# Patient Record
Sex: Female | Born: 1980 | Race: Black or African American | Hispanic: No | Marital: Single | Smoking: Current every day smoker
Health system: Southern US, Community
[De-identification: ages and names within clinical notes are randomized; demographics above are authoritative.]

## PROBLEM LIST (undated history)

## (undated) HISTORY — PX: NO PAST SURGERIES: SHX2092

---

## 2017-04-19 ENCOUNTER — Emergency Department
Admission: EM | Admit: 2017-04-19 | Discharge: 2017-04-19 | Disposition: A | Discharge status: Home / Self Care | Payer: Managed Care, Other (non HMO) | Attending: Student in an Organized Health Care Education/Training Program | Admitting: Student in an Organized Health Care Education/Training Program

## 2017-04-19 ENCOUNTER — Other Ambulatory Visit: Payer: Self-pay

## 2017-04-19 ENCOUNTER — Emergency Department: Payer: Managed Care, Other (non HMO)

## 2017-04-19 ENCOUNTER — Encounter: Payer: Self-pay | Admitting: Emergency Medicine

## 2017-04-19 DIAGNOSIS — Z3A01 Less than 8 weeks gestation of pregnancy: Secondary | ICD-10-CM | POA: Diagnosis not present

## 2017-04-19 DIAGNOSIS — N939 Abnormal uterine and vaginal bleeding, unspecified: Secondary | ICD-10-CM

## 2017-04-19 DIAGNOSIS — O4690 Antepartum hemorrhage, unspecified, unspecified trimester: Secondary | ICD-10-CM

## 2017-04-19 DIAGNOSIS — O200 Threatened abortion: Secondary | ICD-10-CM | POA: Diagnosis not present

## 2017-04-19 DIAGNOSIS — Z87891 Personal history of nicotine dependence: Secondary | ICD-10-CM | POA: Insufficient documentation

## 2017-04-19 DIAGNOSIS — O209 Hemorrhage in early pregnancy, unspecified: Secondary | ICD-10-CM | POA: Insufficient documentation

## 2017-04-19 LAB — HCG, QUANTITATIVE, PREGNANCY: hCG, Beta Chain, Quant, S: 1616 m[IU]/mL — ABNORMAL HIGH (ref <5)

## 2017-04-19 LAB — ABO/RH: ABO/RH(D): A POS

## 2017-04-19 NOTE — ED Notes (Addendum)
Pt states she noticed small amount of vaginal bleeding when she wiped earlier this morning. Pt  states she currently is not experiencing any cramping or abdominal pain

## 2017-04-19 NOTE — ED Provider Notes (Signed)
[facility] Emergency Department Provider Note    First MD Initiated Contact with Patient 04/19/17 1637     (approximate)  I have reviewed the triage vital signs and the nursing notes.   HISTORY  Chief Complaint Vaginal Bleeding    HPI Patricia Sheppard is a 36 y.o. female G4 P0 high risk patient presents roughly [redacted] weeks pregnant with vaginal spotting that started earlier today.  Denies any cramping or pain.  States that she wiped after using the restroom and noted some spotting.  Denies any trauma.  No fevers.  No dysuria.  Has not had confirmed intrauterine pregnancy and is followed by high-[place] OB in [place].  Denies any other complaints.  Is very concerned that this is another miscarriage.  History reviewed. No pertinent past medical history. History reviewed. No pertinent family history. History reviewed. No pertinent surgical history. There are no active problems to display for this patient.     Prior to Admission medications   Not on File    Allergies Sulfa antibiotics    Social History Social History   Tobacco Use  . Smoking status: Former Smoker    Last attempt to quit: 04/05/2017    Years since quitting: 0.0  . Smokeless tobacco: Never Used  Substance Use Topics  . Alcohol use: No    Frequency: Never  . Drug use: No    Review of Systems Patient denies headaches, rhinorrhea, blurry vision, numbness, shortness of breath, chest pain, edema, cough, abdominal pain, nausea, vomiting, diarrhea, dysuria, fevers, rashes or hallucinations unless otherwise stated above in HPI. ____________________________________________   PHYSICAL EXAM:  VITAL SIGNS: Vitals:   04/19/17 1630 04/19/17 1645  BP: 111/85   Pulse: 85 89  Resp:    Temp:    SpO2: 97% 99%    Constitutional: Alert and oriented. Well appearing and in no acute distress. Eyes: Conjunctivae are normal.  Head: Atraumatic. Nose: No congestion/rhinnorhea. Mouth/Throat:  Mucous membranes are moist.   Neck: No stridor. Painless ROM.  Cardiovascular: Normal rate, regular rhythm. Grossly normal heart sounds.  Good peripheral circulation. Respiratory: Normal respiratory effort.  No retractions. Lungs CTAB. Gastrointestinal: Soft and nontender. No distention. No abdominal bruits. No CVA tenderness. Genitourinary: deferred Musculoskeletal: No lower extremity tenderness nor edema.  No joint effusions. Neurologic:  Normal speech and language. No gross focal neurologic deficits are appreciated. No facial droop Skin:  Skin is warm, dry and intact. No rash noted. Psychiatric: Mood and affect are normal. Speech and behavior are normal.  ____________________________________________   LABS (all labs ordered are listed, but only abnormal results are displayed)  Results for orders placed or performed during the hospital encounter of 04/19/17 (from the past 24 hour(s))  hCG, quantitative, pregnancy     Status: Abnormal   Collection Time: 04/19/17  3:26 PM  Result Value Ref Range   hCG, Beta Chain, Quant, S 1,616 (H) <5 mIU/mL  ABO/Rh     Status: None   Collection Time: 04/19/17  3:26 PM  Result Value Ref Range   ABO/RH(D) A POS    ____________________________________________  ____________________________________________  RADIOLOGY  I personally reviewed all radiographic images ordered to evaluate for the above acute complaints and reviewed radiology reports and findings.  These findings were personally discussed with the patient.  Please see medical record for radiology report.  ____________________________________________   PROCEDURES  Procedure(s) performed:  Procedures    Critical Care performed: no ____________________________________________   INITIAL IMPRESSION / ASSESSMENT AND PLAN / ED COURSE  Pertinent labs & imaging results that were available during my care of the patient were reviewed by me and considered in my medical decision making  (see chart for details).  DDX: threatened ab, miscarriage, ectopic, subchorionic hematoma  Patricia Sheppard is a 36 y.o. who presents to the ED with vaginal bleeding in early pregnancy.  Patient is Rh+.  Imaging ordered for the above differential shows intrauterine pregnancy but unable to confirm viable pregnancy likely due to early gestation.  Patient does not have any signs or symptoms of significant acute blood loss.  Patient otherwise stable and appropriate for further workup as an outpatient.      ____________________________________________   FINAL CLINICAL IMPRESSION(S) / ED DIAGNOSES  Final diagnoses:  Vaginal bleeding  Vaginal bleeding in pregnancy  Threatened miscarriage      NEW MEDICATIONS STARTED DURING THIS VISIT:  This SmartLink is deprecated. Use AVSMEDLIST instead to display the medication list for a patient.   Note:  This document was prepared using Dragon voice recognition software and may include unintentional dictation errors.    Willy Eddyobinson, Vern Prestia, MD 04/19/17 [phone]

## 2017-04-19 NOTE — ED Triage Notes (Signed)
Pt c/o vaginal bleeding starting today. Noticed when wiped after using bathroom. Is [redacted] weeks pregnant. [facility]. G4 P0. has had 3 miscarriages.  Last Monday HCG was 1006 per pt, has not had fridays results yet.

## 2018-05-11 NOTE — L&D Delivery Note (Addendum)
38yo [name] at 33+6wks with IUFD, induction of labor for a fetal demise. She received an epidural and a single 123mcg dose of vaginal cytotec. She delivered at approximately 0315 this morning in the bed, discovered by the nurse fully delivered with placenta in situ. She is female with normal anatomy, no signs of physical defect. I delivered the placenta with gentle cord traction, which was intact. The cord insertion into the baby was clotted. Placenta appeared discoid and calcified. No other abnormalities. Specifically, no cord insertion abnormalities, normal cranium, no evidence of fetal heart defect, normal limbs and spine, normal facial features, normal extremities.  No tears. Bleeding minimal. Fundus firm. Postpartum pitocin started. Baby "Patricia Sheppard" wrapped and passed to her mother. No cord blood collected.  She plans an autopsy and I am collecting placental villi for SNP microarray. Labs collected previously.

## 2018-11-22 ENCOUNTER — Other Ambulatory Visit: Payer: Self-pay

## 2018-11-22 ENCOUNTER — Ambulatory Visit
Admission: EM | Admit: 2018-11-22 | Discharge: 2018-11-22 | Disposition: A | Discharge status: Home / Self Care | Payer: Managed Care, Other (non HMO) | Attending: Urgent Care | Admitting: Urgent Care

## 2018-11-22 DIAGNOSIS — Z3201 Encounter for pregnancy test, result positive: Secondary | ICD-10-CM

## 2018-11-22 DIAGNOSIS — O0990 Supervision of high risk pregnancy, unspecified, unspecified trimester: Secondary | ICD-10-CM | POA: Diagnosis not present

## 2018-11-22 DIAGNOSIS — O2620 Pregnancy care for patient with recurrent pregnancy loss, unspecified trimester: Secondary | ICD-10-CM

## 2018-11-22 DIAGNOSIS — O210 Mild hyperemesis gravidarum: Secondary | ICD-10-CM

## 2018-11-22 LAB — CBC WITH DIFFERENTIAL/PLATELET
Abs Immature Granulocytes: 0.06 10*3/uL (ref 0.00–0.07)
Basophils Absolute: 0 10*3/uL (ref 0.0–0.1)
Basophils Relative: 0 %
Eosinophils Absolute: 0 10*3/uL (ref 0.0–0.5)
Eosinophils Relative: 0 %
HCT: 32.4 % — ABNORMAL LOW (ref 36.0–46.0)
Hemoglobin: 11.2 g/dL — ABNORMAL LOW (ref 12.0–15.0)
Immature Granulocytes: 1 %
Lymphocytes Relative: 15 %
Lymphs Abs: 1.2 10*3/uL (ref 0.7–4.0)
MCH: 33 pg (ref 26.0–34.0)
MCHC: 34.6 g/dL (ref 30.0–36.0)
MCV: 95.6 fL (ref 80.0–100.0)
Monocytes Absolute: 1.1 10*3/uL — ABNORMAL HIGH (ref 0.1–1.0)
Monocytes Relative: 15 %
Neutro Abs: 5.5 10*3/uL (ref 1.7–7.7)
Neutrophils Relative %: 69 %
Platelets: 181 10*3/uL (ref 150–400)
RBC: 3.39 MIL/uL — ABNORMAL LOW (ref 3.87–5.11)
RDW: 13.4 % (ref 11.5–15.5)
WBC: 7.9 10*3/uL (ref 4.0–10.5)
nRBC: 0 % (ref 0.0–0.2)

## 2018-11-22 LAB — HCG, QUANTITATIVE, PREGNANCY: hCG, Beta Chain, Quant, S: 40947 m[IU]/mL — ABNORMAL HIGH (ref <5)

## 2018-11-22 LAB — BASIC METABOLIC PANEL
Anion gap: 10 (ref 5–15)
BUN: 6 mg/dL (ref 6–20)
CO2: 21 mmol/L — ABNORMAL LOW (ref 22–32)
Calcium: 8.8 mg/dL — ABNORMAL LOW (ref 8.9–10.3)
Chloride: 102 mmol/L (ref 98–111)
Creatinine, Ser: 0.63 mg/dL (ref 0.44–1.00)
GFR calc Af Amer: >60 mL/min (ref >60)
GFR calc non Af Amer: >60 mL/min (ref >60)
Glucose, Bld: 80 mg/dL (ref 70–99)
Potassium: 3.5 mmol/L (ref 3.5–5.1)
Sodium: 133 mmol/L — ABNORMAL LOW (ref 135–145)

## 2018-11-22 LAB — PREGNANCY, URINE: Preg Test, Ur: POSITIVE — AB

## 2018-11-22 MED ORDER — ONDANSETRON 4 MG PO TBDP: 4.0000 mg | ORAL_TABLET | Freq: Three times a day (TID) | ORAL | 0 refills | Status: DC | PRN

## 2018-11-22 NOTE — Discharge Instructions (Addendum)
It was very nice seeing you today in clinic. Thank you for entrusting me with your care.   Please utilize the medications that we discussed. Your prescriptionshave been called in to your pharmacy. Stay HYDRATED. Water and electrolyte containing beverages (Gatorade, Pedialyte) are best to prevent dehydration and electrolyte abnormalities.   Make arrangements to follow up with OB/GYN to establish care and for US imaging. I have provided you the name and office contact information for an excellent local provider. If your symptoms/condition worsens, please seek follow up care either here or in the ER. Please remember, our [place] providers are "right here with you" when you need [place].   Again, it was my pleasure to take care of you today. Thank you for choosing our clinic. I hope that you start to feel better quickly.   Honor Loh, MSN, APRN, FNP-C, CEN Advanced Practice Provider [facility]

## 2018-11-22 NOTE — ED Triage Notes (Signed)
Patient states that she has been unable to keep any food down with vomiting and nausea. Patient reports that she took an at home pregnancy test and it was positive. Patient states that the last menstrual cycle she remembers was December but thinks that she might have spotted during some months.

## 2018-11-22 NOTE — ED Provider Notes (Signed)
[place], [place]   Name: Patricia Sheppard DOB: 1981-04-25 MRN: [id number] CSN: [id number] PCP: Patient, No Pcp Per  Arrival date and time:  11/22/18 1051  Chief Complaint:  Possible Pregnancy   NOTE: Prior to seeing the patient today, I have reviewed the triage nursing documentation and vital signs. Clinical staff has updated patient's PMH/PSHx, current medication list, and drug allergies/intolerances to ensure comprehensive history available to assist in medical decision making.   History:   HPI: Patricia Sheppard is a 38 y.o. female who presents today with complaints of nausea and vomiting since last week. Symptoms worse in the morning. Patient states, "I just cant seem to keep anything down in the mornings". LMP was in December, however patient advising that she experienced some spotting a couple of months back.  Patient has taken a home pregnancy test that resulted as positive. She indicates that she does not have a OB/GYN provider. Patient denies pelvic, back, and abdominal pain. She has not experienced any urinary symptoms, vaginal bleeding, or vaginal discharge since the onset of her symptoms.  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 She was able to carry one of her pregnancies to 23 weeks and one of them to 24 weeks prior to suffering the physical and psychological effects of fetal demise. Patient becomes emotional during intake assessment today when she talks about her miscarriages. She states, "they made me deliver two times, but the babies had already died".    History reviewed. No pertinent past medical history.  Past Surgical History:  Procedure Laterality Date  . NO PAST SURGERIES      History reviewed. No pertinent family history.  Social History   Tobacco Use  . Smoking status: Former Smoker    Quit date: 04/05/2017    Years since quitting: 1.6  . Smokeless tobacco: Never Used  Substance Use Topics  . Alcohol use: No     Frequency: Never  . Drug use: No    There are no active problems to display for this patient.   Home Medications:    No outpatient medications have been marked as taking for the 11/22/18 encounter [facility] Encounter).    Allergies:   Sulfa antibiotics  Review of Systems (ROS): Review of Systems  Constitutional: Negative for chills and fever.  Respiratory: Negative for cough and shortness of breath.   Cardiovascular: Negative for chest pain and palpitations.  Gastrointestinal: Positive for nausea and vomiting. Negative for abdominal pain.  Genitourinary: Negative for dysuria, flank pain, frequency, hematuria, pelvic pain, vaginal bleeding, vaginal discharge and vaginal pain.  Musculoskeletal: Negative for back pain.  Skin: Negative for color change, pallor and rash.  Neurological: Negative for dizziness, syncope, weakness and headaches.  Psychiatric/Behavioral: The patient is nervous/anxious.      Vital Signs: Today's Vitals   11/22/18 1143 11/22/18 1144 11/22/18 1146 11/22/18 1256  BP:   107/77   Pulse:   (!) 101   Resp:   18   Temp:   98.9 F (37.2 C)   TempSrc:   Oral   SpO2:   100%   Weight:  180 lb (81.6 kg)    Height:  5\' 7"  (1.702 m)    PainSc: 0-No pain   0-No pain    Physical Exam: Physical Exam  Constitutional: She is oriented to person, place, and time and well-developed, well-nourished, and in no distress.  HENT:  Head: Normocephalic and atraumatic.  Mouth/Throat: Mucous membranes are normal.  Eyes:  Pupils are equal, round, and reactive to light. EOM are normal.  Neck: Normal range of motion. No tracheal deviation present.  Cardiovascular: Normal rate, regular rhythm, normal heart sounds and intact distal pulses. Exam reveals no gallop and no friction rub.  No murmur heard. Pulmonary/Chest: Effort normal and breath sounds normal. No respiratory distress. She has no wheezes. She has no rales.  Abdominal: Soft. Normal appearance and bowel sounds are  normal. There is no abdominal tenderness. There is no CVA tenderness.  Neurological: She is alert and oriented to person, place, and time. Gait normal. GCS score is 15.  Skin: Skin is warm and dry. No rash noted.  Psychiatric: Memory, affect and judgment normal. Her mood appears anxious.  Nursing note and vitals reviewed.   Urgent Care Treatments / Results:   LABS: PLEASE NOTE: all labs that were ordered this encounter are listed, however only abnormal results are displayed. Labs Reviewed  PREGNANCY, URINE - Abnormal; Notable for the following components:      Result Value   Preg Test, Ur POSITIVE (*)    All other components within normal limits  HCG, QUANTITATIVE, PREGNANCY - Abnormal; Notable for the following components:   hCG, Beta Chain, Quant, S 40,947 (*)    All other components within normal limits  CBC WITH DIFFERENTIAL/PLATELET - Abnormal; Notable for the following components:   RBC 3.39 (*)    Hemoglobin 11.2 (*)    HCT 32.4 (*)    Monocytes Absolute 1.1 (*)    All other components within normal limits  BASIC METABOLIC PANEL - Abnormal; Notable for the following components:   Sodium 133 (*)    CO2 21 (*)    Calcium 8.8 (*)    All other components within normal limits    EKG: -None  RADIOLOGY: No results found.  PROCEDURES: Procedures  MEDICATIONS RECEIVED THIS VISIT: Medications - No data to display  PERTINENT CLINICAL COURSE NOTES/UPDATES:   Initial Impression / Assessment and Plan / Urgent Care Course:  Pertinent labs & imaging results that were available during my care of the patient were personally reviewed by me and considered in my medical decision making (see lab/imaging section of note for values and interpretations).  Sheilah MinsMiltonya Vollmer is a 38 y.o. female who [facility] today with complaints of Possible Pregnancy  Patient overall well appearing and in no acute distress today in clinic. Exam benign. Urine HCG (+) for pregnancy.  She advises that she can last recall a normal period sometime in December. She has been spotting intermittently over the last 2 months. CBC grossly normal. Beta HCG elevation corresponds with a 5-6 week pregnancy. Patient is a high risk G4P0A4s due to an incompetent cervix and advanced maternal age (>37 years old). Patient emotional in clinic. We discussed at length that in the absence of her LMP, an exact gestational age could not be provided. She will require further testing to determine gestational age and to confirm a viable intrauterine pregnancy.  Patient has no pain in her abdomen or pelvis today in clinic. She is not bleeding.  Will provide patient with antiemetic medication to use for reported nausea and vomiting that occurs mainly in the morning hours. Discussed this as being a normal pregnancy related symptom. Renal function normal. Sodium a little low at 133 mmol/L. Encouraged patient to remain hydrated, with water being the best option. Encouraged her to incorporate some electrolyte enriched fluids into her daily intake to prevent dehydration and electrolyte derangements.  Patient needs to be seen for further evaluation by high risk OB/GYN. Name and office contact information provided on today's AVS for Dr. Christeen DouglasBethany Beasley. Patient advised the she will need to contact the office to schedule an appointment to be seen ASAP. Patient observed attempting to call the office as she was leaving the clinic exam room today.   I have reviewed the follow up and strict return precautions for any new or worsening symptoms. Patient is aware of symptoms that would be deemed urgent/emergent, and would thus require further evaluation either here or in the emergency department. At the time of discharge, she verbalized understanding and consent with the discharge plan as it was reviewed with her. All questions were fielded by provider and/or clinic staff prior to patient discharge.    Final Clinical Impressions /  Urgent Care Diagnoses:   Final diagnoses:  Morning sickness  High risk pregnancy, antepartum    New Prescriptions:  [place] Controlled Substance Registry consulted? Not Applicable  Meds ordered this encounter  Medications  . ondansetron (ZOFRAN-ODT) 4 MG disintegrating tablet    Sig: Take 1 tablet (4 mg total) by mouth every 8 (eight) hours as needed for nausea or vomiting.    Dispense:  15 tablet    Refill:  0    Recommended Follow up Care:  Patient encouraged to follow up with the following provider within the specified time frame, or sooner as dictated by the severity of her symptoms. As always, she was instructed that for any urgent/emergent care needs, she should seek care either here or in the emergency department for more immediate evaluation.  Follow-up Information    Call  Christeen DouglasBeasley, Bethany, MD.   Specialty: Obstetrics and Gynecology Why: Need to be seen to establish prenatal care services due to HIGH RISK pregnancy. Contact information: [address] [place] [id number] [phone]         NOTE: This note was prepared using Dragon dictation software along with smaller phrase technology. Despite my best ability to proofread, there is the potential that transcriptional errors may still occur from this process, and are completely unintentional.     Verlee MonteGray, Myrical Andujo E, NP 11/22/18 2259

## 2018-11-25 LAB — OB RESULTS CONSOLE RPR: RPR: NONREACTIVE

## 2018-11-25 LAB — OB RESULTS CONSOLE VARICELLA ZOSTER ANTIBODY, IGG: Varicella: IMMUNE

## 2018-11-25 LAB — OB RESULTS CONSOLE RUBELLA ANTIBODY, IGM: Rubella: IMMUNE

## 2018-11-25 LAB — OB RESULTS CONSOLE HEPATITIS B SURFACE ANTIGEN: Hepatitis B Surface Ag: NEGATIVE

## 2018-12-01 LAB — OB RESULTS CONSOLE GC/CHLAMYDIA
Chlamydia: NEGATIVE
Gonorrhea: NEGATIVE

## 2018-12-08 ENCOUNTER — Other Ambulatory Visit: Payer: Self-pay

## 2018-12-09 ENCOUNTER — Encounter: Payer: Self-pay | Admitting: Internal Medicine

## 2018-12-09 ENCOUNTER — Inpatient Hospital Stay: Payer: Managed Care, Other (non HMO) | Admitting: *Deleted

## 2018-12-09 ENCOUNTER — Other Ambulatory Visit: Payer: Self-pay

## 2018-12-09 ENCOUNTER — Inpatient Hospital Stay: Payer: Managed Care, Other (non HMO) | Attending: Internal Medicine | Admitting: Internal Medicine

## 2018-12-09 VITALS — BP 114/77 | HR 93 | Temp 98.4°F | Resp 20 | Ht 67.0 in

## 2018-12-09 DIAGNOSIS — Z8249 Family history of ischemic heart disease and other diseases of the circulatory system: Secondary | ICD-10-CM | POA: Insufficient documentation

## 2018-12-09 DIAGNOSIS — D649 Anemia, unspecified: Secondary | ICD-10-CM | POA: Diagnosis not present

## 2018-12-09 DIAGNOSIS — Z87891 Personal history of nicotine dependence: Secondary | ICD-10-CM | POA: Diagnosis not present

## 2018-12-09 DIAGNOSIS — O99013 Anemia complicating pregnancy, third trimester: Secondary | ICD-10-CM | POA: Diagnosis present

## 2018-12-09 DIAGNOSIS — Z79899 Other long term (current) drug therapy: Secondary | ICD-10-CM | POA: Diagnosis not present

## 2018-12-09 DIAGNOSIS — R760 Raised antibody titer: Secondary | ICD-10-CM

## 2018-12-09 LAB — CBC WITH DIFFERENTIAL/PLATELET
Abs Immature Granulocytes: 0.03 10*3/uL (ref 0.00–0.07)
Basophils Absolute: 0 10*3/uL (ref 0.0–0.1)
Basophils Relative: 0 %
Eosinophils Absolute: 0 10*3/uL (ref 0.0–0.5)
Eosinophils Relative: 0 %
HCT: 30.4 % — ABNORMAL LOW (ref 36.0–46.0)
Hemoglobin: 10 g/dL — ABNORMAL LOW (ref 12.0–15.0)
Immature Granulocytes: 1 %
Lymphocytes Relative: 24 %
Lymphs Abs: 1.3 10*3/uL (ref 0.7–4.0)
MCH: 32.4 pg (ref 26.0–34.0)
MCHC: 32.9 g/dL (ref 30.0–36.0)
MCV: 98.4 fL (ref 80.0–100.0)
Monocytes Absolute: 0.8 10*3/uL (ref 0.1–1.0)
Monocytes Relative: 15 %
Neutro Abs: 3.1 10*3/uL (ref 1.7–7.7)
Neutrophils Relative %: 60 %
Platelets: 172 10*3/uL (ref 150–400)
RBC: 3.09 MIL/uL — ABNORMAL LOW (ref 3.87–5.11)
RDW: 14.8 % (ref 11.5–15.5)
WBC: 5.3 10*3/uL (ref 4.0–10.5)
nRBC: 0 % (ref 0.0–0.2)

## 2018-12-09 LAB — VITAMIN B12: Vitamin B-12: 218 pg/mL (ref 180–914)

## 2018-12-09 LAB — LACTATE DEHYDROGENASE: LDH: 132 U/L (ref 98–192)

## 2018-12-09 LAB — IRON AND TIBC
Iron: 96 ug/dL (ref 28–170)
Saturation Ratios: 23 % (ref 10.4–31.8)
TIBC: 412 ug/dL (ref 250–450)
UIBC: 316 ug/dL

## 2018-12-09 LAB — DAT, POLYSPECIFIC AHG (ARMC ONLY): Polyspecific AHG test: NEGATIVE

## 2018-12-09 LAB — RETICULOCYTES
Immature Retic Fract: 18.8 % — ABNORMAL HIGH (ref 2.3–15.9)
RBC.: 3.09 MIL/uL — ABNORMAL LOW (ref 3.87–5.11)
Retic Count, Absolute: 144 10*3/uL (ref 19.0–186.0)
Retic Ct Pct: 4.7 % — ABNORMAL HIGH (ref 0.4–3.1)

## 2018-12-09 LAB — FERRITIN: Ferritin: 79 ng/mL (ref 11–307)

## 2018-12-09 LAB — FOLATE: Folate: 9.4 ng/mL (ref >5.9)

## 2018-12-09 NOTE — Progress Notes (Signed)
[facility] CONSULT NOTE  Patient Care Team: Patient, No Pcp Per as PCP - General (General Practice)  CHIEF COMPLAINTS/PURPOSE OF CONSULTATION: Abnormal antibodies in blood  #July 2020-screening/pregnancy-multiple red cell antibodies [incidental] Oncology History   No history exists.     HISTORY OF PRESENTING ILLNESS:  Patricia Sheppard 38 y.o.  female who is currently pregnant third trimester with expected date of delivery on January 31, 2019.  Patient had routine blood work done with obstetrics that incidentally showed "multiple red cell antibodies-high titer low avidity antibodies or antibodies to less common antigens".  She has been referred to us for further evaluation and recommendations.  Patient states that she never had previous blood transfusions.  Patient had 4 previous miscarriages.  She never had any blood clots.  The current pregnancy is going well.  Early in the pregnancy she did have morning sickness which is currently improved.   Review of Systems  Constitutional: Negative for chills, diaphoresis, fever, malaise/fatigue and weight loss.  HENT: Negative for nosebleeds and sore throat.   Eyes: Negative for double vision.  Respiratory: Negative for cough, hemoptysis, sputum production, shortness of breath and wheezing.   Cardiovascular: Negative for chest pain, palpitations, orthopnea and leg swelling.  Gastrointestinal: Negative for abdominal pain, blood in stool, constipation, diarrhea, heartburn, melena, nausea and vomiting.  Genitourinary: Negative for dysuria, frequency and urgency.  Musculoskeletal: Negative for back pain and joint pain.  Skin: Negative.  Negative for itching and rash.  Neurological: Negative for dizziness, tingling, focal weakness, weakness and headaches.  Endo/Heme/Allergies: Does not bruise/bleed easily.  Psychiatric/Behavioral: Negative for depression. The patient is not nervous/anxious and does not have insomnia.       MEDICAL HISTORY:  No past medical history on file.  SURGICAL HISTORY: Past Surgical History:  Procedure Laterality Date  . NO PAST SURGERIES      SOCIAL HISTORY: Social History   Socioeconomic History  . Marital status: Single    Spouse name: Not on file  . Number of children: Not on file  . Years of education: Not on file  . Highest education level: Not on file  Occupational History  . Not on file  Social Needs  . Financial resource strain: Not on file  . Food insecurity    Worry: Not on file    Inability: Not on file  . Transportation needs    Medical: Not on file    Non-medical: Not on file  Tobacco Use  . Smoking status: Former Smoker    Years: 15.00    Types: Cigarettes, Cigars    Quit date: 04/05/2017    Years since quitting: 1.6  . Smokeless tobacco: Never Used  Substance and Sexual Activity  . Alcohol use: No    Frequency: Never  . Drug use: No  . Sexual activity: Not on file  Lifestyle  . Physical activity    Days per week: Not on file    Minutes per session: Not on file  . Stress: Not on file  Relationships  . Social Musicianconnections    Talks on phone: Not on file    Gets together: Not on file    Attends religious service: Not on file    Active member of club or organization: Not on file    Attends meetings of clubs or organizations: Not on file    Relationship status: Not on file  . Intimate partner violence    Fear of current or ex partner: Not on file  Emotionally abused: Not on file    Physically abused: Not on file    Forced sexual activity: Not on file  Other Topics Concern  . 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 Lives self at home; [place] in [place]     FAMILY HISTORY: Family History  Problem Relation Age of Onset  . Hypertension Maternal Grandmother     ALLERGIES:  is allergic to sulfa antibiotics.  MEDICATIONS:  Current Outpatient Medications   Medication Sig Dispense Refill  . Prenatal Vit-Fe Fumarate-FA (PNV PRENATAL PLUS MULTIVITAMIN) 27-1 MG TABS Take 1 tablet by mouth daily.    . promethazine (PHENERGAN) 25 MG tablet Take 1 tablet by mouth daily as needed for nausea.    . ondansetron (ZOFRAN-ODT) 4 MG disintegrating tablet Take 1 tablet (4 mg total) by mouth every 8 (eight) hours as needed for nausea or vomiting. 15 tablet 0   No current facility-administered medications for this visit.       Marland Kitchen.  PHYSICAL EXAMINATION: ECOG PERFORMANCE STATUS: 0 - Asymptomatic  Vitals:   12/09/18 1442  BP: 114/77  Pulse: 93  Resp: 20  Temp: 98.4 F (36.9 C)   There were no vitals filed for this visit.  Physical Exam  Constitutional: She is oriented to person, place, and time and well-developed, well-nourished, and in no distress.  HENT:  Head: Normocephalic and atraumatic.  Mouth/Throat: Oropharynx is clear and moist. No oropharyngeal exudate.  Eyes: Pupils are equal, round, and reactive to light.  Neck: Normal range of motion. Neck supple.  Cardiovascular: Normal rate and regular rhythm.  Pulmonary/Chest: No respiratory distress. She has no wheezes.  Abdominal: Soft. Bowel sounds are normal. She exhibits no distension and no mass. There is no abdominal tenderness. There is no rebound and no guarding.  Musculoskeletal: Normal range of motion.        General: No tenderness or edema.  Neurological: She is alert and oriented to person, place, and time.  Skin: Skin is warm.  Psychiatric: Affect normal.     LABORATORY DATA:  I have reviewed the data as listed Lab Results  Component Value Date   WBC 5.3 12/09/2018   HGB 10.0 (L) 12/09/2018   HCT 30.4 (L) 12/09/2018   MCV 98.4 12/09/2018   PLT 172 12/09/2018   Recent Labs    11/22/18 1151  NA 133*  K 3.5  CL 102  CO2 21*  GLUCOSE 80  BUN 6  CREATININE 0.63  CALCIUM 8.8*  GFRNONAA >60  GFRAA >60    RADIOGRAPHIC STUDIES: I have personally reviewed the  radiological images as listed and agreed with the findings in the report. No results found.  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  Clinically asymptomatic/insignificant at this time.  I discussed with blood bank-who recommends repeat antibody screen-otherwise not of clinical significance.  #Mild anemia hemoglobin 11-repeat CBC; hemolysis panel; iron studies.  #DISPOSITION:  # labs today-  # Follow up as needed- Dr.B  Thank you, Ms.Mcvey CNM for allowing me to participate in the care of your pleasant patient. Please do not hesitate to contact me with questions or concerns in the interim.  Cc; McVey  Addendum: Patient hemoglobin is 10; saturation 23% B12-low normal at 218.  Folic acid normal.  Recommend B12 1000 mcg a day sublingual.     All questions were answered. The patient knows to call the clinic  with any problems, questions or concerns.       Cammie Sickle, MD 12/13/2018 1:07 PM

## 2018-12-09 NOTE — Assessment & Plan Note (Addendum)
#  Multiple antibodies to red cells antigen-incidental.  Clinically asymptomatic/insignificant at this time.  I discussed with blood bank-who recommends repeat antibody screen-otherwise not of clinical significance.  #Mild anemia hemoglobin 11-repeat CBC; hemolysis panel; iron studies.  #DISPOSITION:  # labs today-  # Follow up as needed- Dr.B  Thank you, Ms.Mcvey CNM for allowing me to participate in the care of your pleasant patient. Please do not hesitate to contact me with questions or concerns in the interim.  Cc; McVey  Addendum: Patient hemoglobin is 10; saturation 23% B12-low normal at 218.  Folic acid normal.  Recommend B12 1000 mcg a day sublingual.

## 2018-12-10 LAB — ANTIBODY SCREEN: Antibody Screen: POSITIVE

## 2018-12-13 ENCOUNTER — Telehealth: Payer: Self-pay | Admitting: Internal Medicine

## 2018-12-13 NOTE — Telephone Encounter (Signed)
[name] please inform patient that -blood work shows mild anemia hemoglobin 10 as expected from pregnancy.  B12 is low end of the normal.  Recommend continue taking prenatal vitamins.  Also recommend taking B12 sublingual 1000 mcg once a day/over-the-counter.  #With regards to antibodies noted in the blood work-this should not be clinically be significant.  I discussed with blood bank.   #Follow-up as planned/no other recommendations.

## 2018-12-13 NOTE — Telephone Encounter (Signed)
Call attempt 1400. Unable to leave voice mail for patient

## 2018-12-14 NOTE — Telephone Encounter (Signed)
Spoke with patient. She is aware of the results and plan of care and need of cont. Prenatal vitamins and otc b12. She will cont. To f/u with her ob provider.

## 2018-12-18 ENCOUNTER — Inpatient Hospital Stay: Payer: Managed Care, Other (non HMO) | Admitting: Anesthesiology

## 2018-12-18 ENCOUNTER — Inpatient Hospital Stay
Admission: EM | Admit: 2018-12-18 | Discharge: 2018-12-20 | DRG: 807 | Disposition: A | Discharge status: Home / Self Care | Payer: Managed Care, Other (non HMO) | Attending: Obstetrics and Gynecology | Admitting: Obstetrics and Gynecology

## 2018-12-18 ENCOUNTER — Other Ambulatory Visit: Payer: Self-pay

## 2018-12-18 ENCOUNTER — Inpatient Hospital Stay: Payer: Managed Care, Other (non HMO)

## 2018-12-18 DIAGNOSIS — O364XX0 Maternal care for intrauterine death, not applicable or unspecified: Principal | ICD-10-CM | POA: Diagnosis present

## 2018-12-18 DIAGNOSIS — Z3A33 33 weeks gestation of pregnancy: Secondary | ICD-10-CM

## 2018-12-18 DIAGNOSIS — O9902 Anemia complicating childbirth: Secondary | ICD-10-CM | POA: Diagnosis present

## 2018-12-18 DIAGNOSIS — Z20828 Contact with and (suspected) exposure to other viral communicable diseases: Secondary | ICD-10-CM | POA: Diagnosis present

## 2018-12-18 DIAGNOSIS — D649 Anemia, unspecified: Secondary | ICD-10-CM | POA: Diagnosis present

## 2018-12-18 LAB — SARS CORONAVIRUS 2 BY RT PCR (HOSPITAL ORDER, PERFORMED IN ~~LOC~~ HOSPITAL LAB): SARS Coronavirus 2: NEGATIVE

## 2018-12-18 LAB — URINE DRUG SCREEN, QUALITATIVE (ARMC ONLY)
Amphetamines, Ur Screen: NOT DETECTED
Barbiturates, Ur Screen: NOT DETECTED
Benzodiazepine, Ur Scrn: NOT DETECTED
Cannabinoid 50 Ng, Ur ~~LOC~~: NOT DETECTED
Cocaine Metabolite,Ur ~~LOC~~: NOT DETECTED
MDMA (Ecstasy)Ur Screen: NOT DETECTED
Methadone Scn, Ur: NOT DETECTED
Opiate, Ur Screen: NOT DETECTED
Phencyclidine (PCP) Ur S: NOT DETECTED
Tricyclic, Ur Screen: NOT DETECTED

## 2018-12-18 LAB — HEMOGLOBIN A1C
Hgb A1c MFr Bld: 4.3 % — ABNORMAL LOW (ref 4.8–5.6)
Mean Plasma Glucose: 76.71 mg/dL

## 2018-12-18 LAB — CBC
HCT: 30.8 % — ABNORMAL LOW (ref 36.0–46.0)
Hemoglobin: 10.1 g/dL — ABNORMAL LOW (ref 12.0–15.0)
MCH: 32.8 pg (ref 26.0–34.0)
MCHC: 32.8 g/dL (ref 30.0–36.0)
MCV: 100 fL (ref 80.0–100.0)
Platelets: 194 10*3/uL (ref 150–400)
RBC: 3.08 MIL/uL — ABNORMAL LOW (ref 3.87–5.11)
RDW: 14.8 % (ref 11.5–15.5)
WBC: 4.5 10*3/uL (ref 4.0–10.5)
nRBC: 0 % (ref 0.0–0.2)

## 2018-12-18 LAB — KLEIHAUER-BETKE STAIN
Fetal Cells %: 0.5 %
Quantitation Fetal Hemoglobin: 0.0023 mL

## 2018-12-18 LAB — TSH: TSH: 3.729 u[IU]/mL (ref 0.350–4.500)

## 2018-12-18 LAB — RAPID HIV SCREEN (HIV 1/2 AB+AG)
HIV 1/2 Antibodies: NONREACTIVE
HIV-1 P24 Antigen - HIV24: NONREACTIVE

## 2018-12-18 LAB — PROTIME-INR
INR: 0.9 (ref 0.8–1.2)
Prothrombin Time: 12.3 seconds (ref 11.4–15.2)

## 2018-12-18 LAB — FIBRIN DERIVATIVES D-DIMER (ARMC ONLY): Fibrin derivatives D-dimer (ARMC): 6344.64 ng/mL (FEU) — ABNORMAL HIGH (ref 0.00–499.00)

## 2018-12-18 MED ORDER — OXYTOCIN 40 UNITS IN NORMAL SALINE INFUSION - SIMPLE MED
2.5000 [IU]/h | INTRAVENOUS | Status: DC
  Administered 2018-12-19: 2.5 [IU]/h via INTRAVENOUS

## 2018-12-18 MED ORDER — ACETAMINOPHEN 325 MG PO TABS: 650.0000 mg | ORAL_TABLET | ORAL | Status: DC | PRN

## 2018-12-18 MED ORDER — LACTATED RINGERS IV SOLN: 500.0000 mL | INTRAVENOUS | Status: DC | PRN

## 2018-12-18 MED ORDER — MISOPROSTOL 200 MCG PO TABS
ORAL_TABLET | ORAL | Status: AC
  Filled 2018-12-18: qty 4

## 2018-12-18 MED ORDER — MISOPROSTOL 200 MCG PO TABS: 200.0000 ug | ORAL_TABLET | ORAL | Status: DC | PRN

## 2018-12-18 MED ORDER — LIDOCAINE HCL (PF) 1 % IJ SOLN
30.0000 mL | INTRAMUSCULAR | Status: AC | PRN
  Administered 2018-12-18: 17:00:00 1.3 mL via SUBCUTANEOUS

## 2018-12-18 MED ORDER — AMMONIA AROMATIC IN INHA
RESPIRATORY_TRACT | Status: AC
  Filled 2018-12-18: qty 10

## 2018-12-18 MED ORDER — LIDOCAINE-EPINEPHRINE (PF) 1.5 %-1:200000 IJ SOLN
INTRAMUSCULAR | Status: DC | PRN
  Administered 2018-12-18: 3 mL via EPIDURAL

## 2018-12-18 MED ORDER — ONDANSETRON HCL 4 MG/2ML IJ SOLN
4.0000 mg | Freq: Four times a day (QID) | INTRAMUSCULAR | Status: DC | PRN
  Administered 2018-12-18: 4 mg via INTRAVENOUS
  Filled 2018-12-18: qty 2

## 2018-12-18 MED ORDER — PHENYLEPHRINE 40 MCG/ML (10ML) SYRINGE FOR IV PUSH (FOR BLOOD PRESSURE SUPPORT): 80.0000 ug | PREFILLED_SYRINGE | INTRAVENOUS | Status: DC | PRN

## 2018-12-18 MED ORDER — LACTATED RINGERS IV SOLN
INTRAVENOUS | Status: DC
  Administered 2018-12-18 – 2018-12-19 (×2): via INTRAVENOUS

## 2018-12-18 MED ORDER — OXYTOCIN 10 UNIT/ML IJ SOLN
INTRAMUSCULAR | Status: AC
  Filled 2018-12-18: qty 2

## 2018-12-18 MED ORDER — MISOPROSTOL 50MCG HALF TABLET
100.0000 ug | ORAL_TABLET | ORAL | Status: DC | PRN
  Administered 2018-12-18: 100 ug via VAGINAL
  Filled 2018-12-18 (×2): qty 2

## 2018-12-18 MED ORDER — OXYTOCIN 40 UNITS IN NORMAL SALINE INFUSION - SIMPLE MED
INTRAVENOUS | Status: AC
  Filled 2018-12-18: qty 1000

## 2018-12-18 MED ORDER — FENTANYL 2.5 MCG/ML W/ROPIVACAINE 0.15% IN NS 100 ML EPIDURAL (ARMC)
EPIDURAL | Status: DC | PRN
  Administered 2018-12-18: 12 mL/h via EPIDURAL

## 2018-12-18 MED ORDER — FENTANYL 2.5 MCG/ML W/ROPIVACAINE 0.15% IN NS 100 ML EPIDURAL (ARMC)
12.0000 mL/h | EPIDURAL | Status: DC
  Administered 2018-12-18 – 2018-12-19 (×2): 12 mL/h via EPIDURAL
  Filled 2018-12-18: qty 100

## 2018-12-18 MED ORDER — BUTORPHANOL TARTRATE 1 MG/ML IJ SOLN: 1.0000 mg | INTRAMUSCULAR | Status: DC | PRN

## 2018-12-18 MED ORDER — EPHEDRINE 5 MG/ML INJ: 10.0000 mg | INTRAVENOUS | Status: DC | PRN

## 2018-12-18 MED ORDER — OXYTOCIN BOLUS FROM INFUSION
500.0000 mL | Freq: Once | INTRAVENOUS | Status: AC
  Administered 2018-12-19: 03:00:00 500 mL via INTRAVENOUS

## 2018-12-18 MED ORDER — LIDOCAINE HCL (PF) 1 % IJ SOLN
INTRAMUSCULAR | Status: AC
  Filled 2018-12-18: qty 30

## 2018-12-18 MED ORDER — OXYTOCIN 40 UNITS IN NORMAL SALINE INFUSION - SIMPLE MED: 1.0000 m[IU]/min | INTRAVENOUS | Status: DC

## 2018-12-18 MED ORDER — SOD CITRATE-CITRIC ACID 500-334 MG/5ML PO SOLN: 30.0000 mL | ORAL | Status: DC | PRN

## 2018-12-18 MED ORDER — DIPHENHYDRAMINE HCL 50 MG/ML IJ SOLN: 12.5000 mg | INTRAMUSCULAR | Status: DC | PRN

## 2018-12-18 MED ORDER — ZOLPIDEM TARTRATE 5 MG PO TABS
5.0000 mg | ORAL_TABLET | Freq: Every evening | ORAL | Status: DC | PRN
  Administered 2018-12-18: 5 mg via ORAL
  Filled 2018-12-18: qty 1

## 2018-12-18 MED ORDER — FENTANYL 2.5 MCG/ML W/ROPIVACAINE 0.15% IN NS 100 ML EPIDURAL (ARMC)
EPIDURAL | Status: AC
  Filled 2018-12-18: qty 100

## 2018-12-18 MED ORDER — LACTATED RINGERS IV SOLN
500.0000 mL | Freq: Once | INTRAVENOUS | Status: AC
  Administered 2018-12-18: 500 mL via INTRAVENOUS

## 2018-12-18 NOTE — Anesthesia Preprocedure Evaluation (Signed)
Anesthesia Evaluation  Patient identified by MRN, date of birth, ID band Patient awake    Reviewed: Allergy & Precautions, NPO status , Patient's Chart, lab work & pertinent test results  History of Anesthesia Complications Negative for: history of anesthetic complications  Airway Mallampati: II       Dental   Pulmonary neg sleep apnea, neg COPD, Not current smoker, former smoker,           Cardiovascular (-) hypertension(-) Past MI and (-) CHF (-) dysrhythmias (-) Valvular Problems/Murmurs     Neuro/Psych neg Seizures    GI/Hepatic Neg liver ROS, GERD (with pregnancy, also N/V)  ,  Endo/Other  neg diabetes  Renal/GU negative Renal ROS     Musculoskeletal   Abdominal   Peds  Hematology   Anesthesia Other Findings   Reproductive/Obstetrics (+) Pregnancy (fetal demise)                             Anesthesia Physical Anesthesia Plan  ASA: II  Anesthesia Plan: Epidural   Post-op Pain Management:    Induction:   PONV Risk Score and Plan: 2  Airway Management Planned:   Additional Equipment:   Intra-op Plan:   Post-operative Plan:   Informed Consent: I have reviewed the patients History and Physical, chart, labs and discussed the procedure including the risks, benefits and alternatives for the proposed anesthesia with the patient or authorized representative who has indicated his/her understanding and acceptance.       Plan Discussed with:   Anesthesia Plan Comments:         Anesthesia Quick Evaluation

## 2018-12-18 NOTE — OB Triage Note (Signed)
38 yo, G4P0, [redacted]w[redacted]d came to unit for decreased fetal movement. States she has not felt her baby move since yesterday morning. Hx of 3 previous IUFDs. Denies vaginal bleeding, cramping, LOF. Vitals stable. Monitors applied, RN attempted to find FHR on external monitor, no HR noted. Contacted provider to do bedside US.

## 2018-12-18 NOTE — Progress Notes (Signed)
Ch received an OR regarding the [t that has experienced a fetal death. Pt had sister at bedside. Pt was still under emotional distress regarding the loss. As reported by nurse, the pt has miscarried 4 times in the past and this fetal death was not expected as there were no known complications. Ch guided pt and sister to release their frustrations and express their lament to God considering the pt was due in September. Ch prayed a farewell prayer and informed pt that F/U care will be provided.   F/U: provided spiritual support for pt and family present       12/18/18 1800  Clinical Encounter Type  Visited With Patient and family together;Health care provider  Visit Type Psychological support;Spiritual support;Social support;Death  Referral From Nurse  Consult/Referral To Chaplain  Recommendations F/U with pt and family for spiritual/grief support   Spiritual Encounters  Spiritual Needs Prayer;Emotional;Grief support  Stress Factors  Patient Stress Factors Health changes;Loss;Loss of control;Major life changes  Family Stress Factors Major life changes;Loss

## 2018-12-18 NOTE — Progress Notes (Signed)
S: Pt sleeping soundly after ambien O: Vitals:   12/18/18 1931 12/18/18 2053  BP:    Pulse:    Resp: 18   Temp:  99.9 F (37.7 C)  SpO2:     Labs returned so far undiagnostic. Covid-19 negative.  Normal TSH, CBC significant for mild anemia, UDS negative, normal A1C, normal PT/INR.  D-dimer high at 6344 KB=0.5% fetal blood cells Compatible crossmatch in place if needed for maternal hemorrhage, with expected "NON SPECIFIC ANTIBODY REACTIVITY "  A/P: IUFD at 33 wks Continue planned iol with cytotec and pitocin

## 2018-12-18 NOTE — Anesthesia Procedure Notes (Signed)
Epidural Patient location during procedure: OB Start time: 12/18/2018 4:30 PM End time: 12/18/2018 4:50 PM  Staffing Performed: anesthesiologist   Preanesthetic Checklist Completed: patient identified, site marked, surgical consent, pre-op evaluation, timeout performed, IV checked, risks and benefits discussed and monitors and equipment checked  Epidural Patient position: sitting Prep: Betadine Patient monitoring: heart rate, continuous pulse ox and blood pressure Approach: midline Location: L4-L5 Injection technique: LOR saline  Needle:  Needle type: Tuohy  Needle gauge: 17 G Needle length: 9 cm and 9 Needle insertion depth: 8 cm Catheter type: closed end flexible Catheter size: 19 Gauge Catheter at skin depth: 13 cm Test dose: negative and 1.5% lidocaine with Epi 1:200 K  Assessment Events: blood not aspirated, injection not painful, no injection resistance, negative IV test and no paresthesia  Additional Notes   Patient tolerated the insertion well without complications.Reason for block:procedure for pain

## 2018-12-18 NOTE — H&P (Signed)
OB ADMISSION/ HISTORY & PHYSICAL:  Admission Date: 12/18/2018 11:26 AM  Admit Diagnosis: Fetal demise  Patricia Sheppard is a 38 y.o. [id number] presenting for decreased fetal movement.  Prenatal History: [id number]   EDC : 01/31/2019, by Last Menstrual Period  [facility]atal care at [facility] Prenatal course complicated by  1. Hx PTD (all pre-viable) and cervical insufficiency x 3 (hx cerclage with #3) cervical length 4.07cm at initial [place] at 30wks 2. late [facility], dating based on LMP, c/w [place] at 30wks  3. fibroid noted 6.1cm, left lateral. 4. UTI, + nitrites, e.coli, Rx macrobid 7/19 5. Antibody screen positive: per [facility] report: "suspect multiple RBC antibodies " Ref to Hematology: seen by Heme,  recommended type and screen on admission.  Late to care, first visit with [place] at 30 weeks 11/25/2018  Medical / Surgical History :  Past medical history: History reviewed. No pertinent past medical history.   Past surgical history:  Past Surgical History:  Procedure Laterality Date  . NO PAST SURGERIES      Family History:  Family History  Problem Relation Age of Onset  . Hypertension Maternal Grandmother      Social History:  reports that she quit smoking about 20 months ago. Her smoking use included cigarettes and cigars. She quit after 15.00 years of use. She has never used smokeless tobacco. She reports that she does not drink alcohol or use drugs.   Allergies: Sulfa antibiotics    Current Medications at time of admission:  Prior to Admission medications   Medication Sig Start Date End Date Taking? Authorizing Provider  ondansetron (ZOFRAN-ODT) 4 MG disintegrating tablet Take 1 tablet (4 mg total) by mouth every 8 (eight) hours as needed for nausea or vomiting. 11/22/18   Karen Kitchens, NP  Prenatal Vit-Fe Fumarate-FA (PNV PRENATAL PLUS MULTIVITAMIN) 27-1 MG TABS Take 1 tablet by mouth daily. 11/25/18 11/25/19  [provider]  promethazine (PHENERGAN) 25 MG tablet Take 1 tablet by mouth daily  as needed for nausea. 11/25/18   [provider]     Review of Systems: Decreased FM yesterday morning, no pain or HA  Physical Exam:  VS: Blood pressure 118/81, resp. rate (!) 22, last menstrual period 04/26/2018.  General: alert and oriented, appears in significant distress Heart: RRR Lungs: Clear lung fields Abdomen: Gravid, soft and non-tender, non-distended  FHT: not present TOCO: none SVE:    /   /   pending   Cephalic by leopolds  Prenatal Labs: Blood type/Rh  A pos  Antibody screen Positive, multiple RBC antibodies  Rubella Immune  Varicella Immune  RPR NR  HBsAg Neg  HIV NR  GC neg  Chlamydia neg  Genetic screening Did not get  1 hour GTT 117  3 hour GTT n/a  GBS unknown   US Ob Limited  Result Date: 12/18/2018 CLINICAL DATA:  Fetal demise. EXAM: LIMITED OBSTETRIC ULTRASOUND FINDINGS: Number of Fetuses: 1 Heart Rate: Absent fetal heart rate Movement: Absent Presentation: Cephalic Placental Location: Left-sided. Previa: Absent Amniotic Fluid (Subjective):  Decreased AFI: 8.6 cm cm BPD: 7.2 cm 29 w  1 d MATERNAL FINDINGS: Cervix:  Appears closed. Uterus/Adnexae: No abnormality visualized. IMPRESSION: Absence of fetal heart rate and movement, consistent with fetal demise. This exam is performed on an emergent basis and does not comprehensively evaluate fetal size, dating, or anatomy; follow-up complete OB US should be considered if further fetal assessment is warranted. Electronically Signed   By: Abigail Miyamoto M.D.   On: 12/18/2018 12:57  Assessment: 3855w5d weeks gestation with intrauterine fetal demise  38 y.o. female [phone] 1055w5d with fetal demise diagnosed by formal ultrasound  Plan:  1. Induction of labor: - 100mcg misoprostol vaginally q4 hrs  - toco monitoring only - vitals per unit protocol  2. Pain management:  - epidural if desired - iv stadol 1-2g q 1-2hrs PRN maternal preference. - iv toradol 15mg  q4hrs PRN maternal preference    3. Labs on admission: - CBC - KB - Parvovirus B-19 IgG and IgM - RPR - TSH - Lupus anticoagulant and anticardiolipin antibodies - Hgb A1C - CMP/P:C ratio  4. Pastoral care consult  - PRN maternal wishes  5. Plan for placental/fetal evaluation - fetal exam, photos and recommend autopsy if parental consent given - placenta to pathology - fetal karyotype ( 1x1cm chunk of placenta at base of cord. Will add additional information up to 30% of the time. Fill out paperwork and mail at room temp)  6. Ask about contraception plans 7. Offer "Now I lay me down to sleep" and [facility].   **#5 above: [facility] Chromosome Analysis, Products of Conception (POC) With Reflex to SNP Microarray (Reveal). [facility] TEST #: [id number] [email or web address]://www.[email or web address]/[email or web address]/chromosome-analysis-products-of-conception-poc-with-reflex-to-snp-microarray-reveal.  Aseptically obtain a small piece of tissue with subepidermal layers. Best results are obtained from placental villi, which remain viable much longer than fetal tissue. This is especially true in stillborn cases in which both cord blood and tissue samples are often not viable.

## 2018-12-19 LAB — CBC
HCT: 29.1 % — ABNORMAL LOW (ref 36.0–46.0)
Hemoglobin: 9.7 g/dL — ABNORMAL LOW (ref 12.0–15.0)
MCH: 32.7 pg (ref 26.0–34.0)
MCHC: 33.3 g/dL (ref 30.0–36.0)
MCV: 98 fL (ref 80.0–100.0)
Platelets: 169 10*3/uL (ref 150–400)
RBC: 2.97 MIL/uL — ABNORMAL LOW (ref 3.87–5.11)
RDW: 14.6 % (ref 11.5–15.5)
WBC: 7.9 10*3/uL (ref 4.0–10.5)
nRBC: 0 % (ref 0.0–0.2)

## 2018-12-19 LAB — RPR: RPR Ser Ql: NONREACTIVE

## 2018-12-19 MED ORDER — DIPHENHYDRAMINE HCL 25 MG PO CAPS: 25.0000 mg | ORAL_CAPSULE | Freq: Four times a day (QID) | ORAL | Status: DC | PRN

## 2018-12-19 MED ORDER — CALCIUM GLUCONATE 10 % IV SOLN
INTRAVENOUS | Status: AC
  Filled 2018-12-19: qty 10

## 2018-12-19 MED ORDER — COCONUT OIL OIL: 1.0000 "application " | TOPICAL_OIL | Status: DC | PRN

## 2018-12-19 MED ORDER — MAGNESIUM SULFATE 40 G IN LACTATED RINGERS - SIMPLE
INTRAVENOUS | Status: AC
  Filled 2018-12-19: qty 500

## 2018-12-19 MED ORDER — WITCH HAZEL-GLYCERIN EX PADS: 1.0000 "application " | MEDICATED_PAD | CUTANEOUS | Status: DC | PRN

## 2018-12-19 MED ORDER — ONDANSETRON HCL 4 MG/2ML IJ SOLN: 4.0000 mg | INTRAMUSCULAR | Status: DC | PRN

## 2018-12-19 MED ORDER — SIMETHICONE 80 MG PO CHEW: 80.0000 mg | CHEWABLE_TABLET | ORAL | Status: DC | PRN

## 2018-12-19 MED ORDER — PRENATAL MULTIVITAMIN CH: 1.0000 | ORAL_TABLET | Freq: Every day | ORAL | Status: DC

## 2018-12-19 MED ORDER — SENNOSIDES-DOCUSATE SODIUM 8.6-50 MG PO TABS: 2.0000 | ORAL_TABLET | ORAL | Status: DC

## 2018-12-19 MED ORDER — TETANUS-DIPHTH-ACELL PERTUSSIS 5-2.5-18.5 LF-MCG/0.5 IM SUSP: 0.5000 mL | Freq: Once | INTRAMUSCULAR | Status: DC

## 2018-12-19 MED ORDER — SODIUM CHLORIDE 0.9% FLUSH: 3.0000 mL | INTRAVENOUS | Status: DC | PRN

## 2018-12-19 MED ORDER — OXYCODONE HCL 5 MG PO TABS: 5.0000 mg | ORAL_TABLET | ORAL | Status: DC | PRN

## 2018-12-19 MED ORDER — ALPRAZOLAM 0.5 MG PO TABS: 0.5000 mg | ORAL_TABLET | Freq: Two times a day (BID) | ORAL | Status: DC | PRN

## 2018-12-19 MED ORDER — ONDANSETRON HCL 4 MG PO TABS: 4.0000 mg | ORAL_TABLET | ORAL | Status: DC | PRN

## 2018-12-19 MED ORDER — ZOLPIDEM TARTRATE 5 MG PO TABS: 5.0000 mg | ORAL_TABLET | Freq: Every evening | ORAL | Status: DC | PRN

## 2018-12-19 MED ORDER — SODIUM CHLORIDE 0.9 % IV SOLN: 250.0000 mL | INTRAVENOUS | Status: DC | PRN

## 2018-12-19 MED ORDER — FLEET ENEMA 7-19 GM/118ML RE ENEM: 1.0000 | ENEMA | Freq: Every day | RECTAL | Status: DC | PRN

## 2018-12-19 MED ORDER — BISACODYL 10 MG RE SUPP: 10.0000 mg | Freq: Every day | RECTAL | Status: DC | PRN

## 2018-12-19 MED ORDER — DIBUCAINE (PERIANAL) 1 % EX OINT: 1.0000 "application " | TOPICAL_OINTMENT | CUTANEOUS | Status: DC | PRN

## 2018-12-19 MED ORDER — OXYTOCIN 40 UNITS IN NORMAL SALINE INFUSION - SIMPLE MED
INTRAVENOUS | Status: AC
  Filled 2018-12-19: qty 1000

## 2018-12-19 MED ORDER — MEASLES, MUMPS & RUBELLA VAC IJ SOLR
0.5000 mL | Freq: Once | INTRAMUSCULAR | Status: DC
  Filled 2018-12-19: qty 0.5

## 2018-12-19 MED ORDER — SODIUM CHLORIDE 0.9% FLUSH: 3.0000 mL | Freq: Two times a day (BID) | INTRAVENOUS | Status: DC

## 2018-12-19 MED ORDER — ACETAMINOPHEN 325 MG PO TABS: 650.0000 mg | ORAL_TABLET | ORAL | Status: DC | PRN

## 2018-12-19 MED ORDER — IBUPROFEN 600 MG PO TABS: 600.0000 mg | ORAL_TABLET | Freq: Four times a day (QID) | ORAL | Status: DC

## 2018-12-19 MED ORDER — BENZOCAINE-MENTHOL 20-0.5 % EX AERO: 1.0000 "application " | INHALATION_SPRAY | CUTANEOUS | Status: DC | PRN

## 2018-12-19 NOTE — Progress Notes (Signed)
   12/19/18 1500  Clinical Encounter Type  Visited With Patient and family together  Visit Type Follow-up  Referral From Chaplain  Consult/Referral To Chaplain  Spiritual Encounters  Spiritual Needs Prayer;Emotional;Grief support  Stress Factors  Patient Stress Factors Loss  Chaplain followed up with patient and offered emotional support and prayer. Patient's cousin came and patient ask Chaplain to come back later. Chaplain will follow up.

## 2018-12-19 NOTE — Progress Notes (Signed)
After discussion with the blood bank, the non-specific RBC antibodies in her blood are none of the significant ones. Specifically, it is not to Kell or Duffy. She has never had a blood transfusion.  I have also recommended a neonatal autopsy, which is medically indicated for a fetal demise of unknown etiology in the third trimester. No phenotypically evident abnormalities, which is why an autopsy may show critical information.

## 2018-12-19 NOTE — Discharge Summary (Signed)
Obstetrical Discharge Summary  Patient Name: Patricia MinsMiltonya Sheppard DOB: 06/22/1980 MRN: [id number]  Date of Admission: 12/18/2018 Date of Discharge: 12/20/2018  [facility]   Gestational Age at Delivery: 136w6d   Antepartum complications:  1. Hx PTD (all pre-viable) and cervical insufficiency x 3 (hx cerclage with #3) cervical length 4.07cm at initial US at 30wks [facility]. [facility], c/w US at 30wks  3. fibroid noted 6.1cm, left lateral. 4. UTI, + nitrites, e.coli, Rx macrobid 7/19 5. Antibody screen positive: per [facility] report: "suspect multiple RBC antibodies " Ref to Hematology: seen by Heme, recommended type and cross on admission.  Late to care, first visit with us at 30 weeks 11/25/2018  Admitting Diagnosis: intrauterine fetal demise  Secondary Diagnosis: Patient Active Problem List   Diagnosis Date Noted  . Fetal demise, greater than 22 weeks, antepartum 12/18/2018  . Abnormal antibody titer 12/09/2018    Augmentation: Cytotec Complications: Stillborn Date of Delivery: 12/19/2018 Delivered By: Christeen DouglasBethany Beasley Delivery Type: spontaneous vaginal delivery Anesthesia: epidural Placenta: Spontaneous Laceration: None Episiotomy: none [facility]  Brief Hospital Course  Patricia MinsMiltonya Tozzi is a [id number] who had a SVD on 12/19/2018 for a still birth;  for further details of this delivery, please refer to the delivery note.  Patient had an uncomplicated postpartum course.  By time of discharge on PPD#1, her pain was controlled on oral pain medications; she had appropriate lochia and was ambulating, voiding without difficulty and tolerating regular diet.  She was deemed stable for discharge to home.    Discharge Physical Exam:  BP 116/77 (BP Location: Left Arm)   Pulse 70   Temp 98.7 F (37.1 C) (Oral)   Resp 18   Ht 5\' 7"  (1.702 m)   Wt 85.3 kg   LMP 04/26/2018 (Approximate)   SpO2 100%   BMI 29.44 kg/m   General: NAD CV:  RRR Pulm: CTABL, nl effort ABD: s/nd/nt, fundus firm and below the umbilicus Lochia: moderate DVT Evaluation: LE non-ttp, no evidence of DVT on exam.  Hemoglobin  Date Value Ref Range Status  12/19/2018 9.7 (L) 12.0 - 15.0 g/dL Final   HCT  Date Value Ref Range Status  12/19/2018 29.1 (L) 36.0 - 46.0 % Final    Post partum course: uncomplicated Postpartum Procedures: none Disposition: stable, discharge to home. Baby Disposition: morgue  Rh Immune globulin given: n/a Rubella vaccine given: n/a Tdap vaccine given in AP or PP setting: 11/30/18 Flu vaccine given in AP or PP setting: n/a  Contraception: not dicussed   Prenatal Labs: Blood type/Rh --/--/A POS (08/09 1347)  Antibody screen neg  Rubella Immune  Varicella Immune  RPR NR  HBsAg Neg  HIV NR  GC neg  Chlamydia neg  Genetic screening negative  1 hour GTT 117  3 hour GTT n/a  GBS unknown     Plan:  Patricia MinsMiltonya Kreeger was discharged to home in good condition. [facility] 2 weeks   Discharge Medications: Allergies as of 12/20/2018      Reactions   Sulfa Antibiotics Rash      Medication List    STOP taking these medications   ondansetron 4 MG disintegrating tablet Commonly known as: ZOFRAN-ODT   PNV Prenatal Plus Multivitamin 27-1 MG Tabs   promethazine 25 MG tablet Commonly known as: PHENERGAN     TAKE these medications   acetaminophen 325 MG tablet Commonly known as: TYLENOL Take 2 tablets (  650 mg total) by mouth every 4 (four) hours as needed for mild pain or moderate pain.   ferrous sulfate 325 (65 FE) MG tablet Take 1 tablet (325 mg total) by mouth daily.   ibuprofen 600 MG tablet Commonly known as: ADVIL Take 1 tablet (600 mg total) by mouth every 6 (six) hours.       Follow-up Information    Benjaman Kindler, MD. Schedule an appointment as soon as possible for a visit in 2 week(s).   Specialty: Obstetrics and  Gynecology Why: For follow-up appointment Contact information: [address] [place] [address] [phone]           Signed: Lisette Grinder, CNM 12/20/2018 8:34 AM

## 2018-12-19 NOTE — Progress Notes (Signed)
Patient ID: Patricia Sheppard, female   DOB: [birth year], 38 y.o.   MRN: [id number] [facility] has agreed to do the baby's autopsy for $300 regardless what the patients insurance was going to pay .

## 2018-12-19 NOTE — Progress Notes (Signed)
   12/19/18 2100  Clinical Encounter Type  Visited With Patient and family together  Visit Type Spiritual support  Referral From Chaplain  Consult/Referral To Chaplain  Spiritual Encounters  Spiritual Needs Prayer;Emotional;Grief support  Stress Factors  Patient Stress Factors Loss  Family Stress Factors Major life changes  Chaplain followed up with patient. Patient was lying in bed and cousin at bedside, along with nurse. Nurse was going over some paperwork with patient. Chaplain waited until nurse finished to have visit. Patient and cousin shares the patient history of miscarriages. The patient has faith in God to help her through this. Her cousin is a great support system for patient. Chaplain gave encouraging words of comfort and peace. Chaplain prayed with patient and cousin.

## 2018-12-20 MED ORDER — IBUPROFEN 600 MG PO TABS: 600.0000 mg | ORAL_TABLET | Freq: Four times a day (QID) | ORAL | 0 refills | Status: DC

## 2018-12-20 MED ORDER — FERROUS SULFATE 325 (65 FE) MG PO TABS: 325.0000 mg | ORAL_TABLET | Freq: Every day | ORAL | 0 refills | Status: DC

## 2018-12-20 MED ORDER — ACETAMINOPHEN 325 MG PO TABS: 650.0000 mg | ORAL_TABLET | ORAL | 0 refills | Status: DC | PRN

## 2018-12-20 NOTE — Discharge Instructions (Signed)
Vaginal Delivery, Care After °This sheet gives you information about how to care for yourself after your delivery. Your health care provider may also give you more specific instructions. If you have problems or questions, contact your health care provider. °What can I expect after the procedure? °After delivery, it is common to have: °· Soreness in your abdomen, your vagina, and the area between the opening of your vagina and your anus (perineum). °· Tiredness (fatigue). °· Cramps. °· Breast tenderness related to engorgement. °· Some bleeding and discharge from your vagina. This may continue for about 6 weeks. The bleeding and discharge will start out red, then become pink, then yellow, and finally white. °· Tenderness in your vagina or perineum if you had an episiotomy or a vaginal tear. This may last several weeks. °· Emotions that change quickly. Common emotions include: °? Sadness. °? Anger. °? Denial. °? Guilt. °? Depression. °Follow these instructions at home: °Vaginal and perineal care °· Keep your perineum clean and dry as told by your health care provider. °· Wipe from front to back when you use the toilet. °· If you have an episiotomy or a vaginal tear, check for signs of infection, such as: °? Increasing redness, swelling, or pain in your perineal area. °? Pus or bad-smelling discharge coming from your wound or vagina. °· To relieve pain at the wound area or pain caused by hemorrhoids, try taking a warm sitz bath 2-3 times a day. °· Do not use tampons or douche until your health care provider says it is okay. °Medicines °· Take over-the-counter and prescription medicines only as told by your health care provider. °· If you were prescribed an antibiotic medicine, take it as told by your health care provider. Do not stop taking the antibiotic even if you start to feel better. °Eating and drinking ° °· Drink enough fluids to keep your urine pale yellow. °· Eat high-fiber foods every day. These foods may help  prevent or relieve constipation. High-fiber foods include: °? Whole grain cereals and breads. °? Brown rice. °? Beans. °? Fresh fruits and vegetables. °Activity °· If possible, have someone help you with your household activities for at least a few days after you leave the hospital. °· Return to your normal activities as told by your health care provider. Ask your health care provider what activities are safe for you. °· Rest as much as possible. °· Talk with your health care provider about when you can engage in sexual activity. This may depend on your: °? Risk of infection. °? Rate of healing. °? Comfort and desire to engage in sexual activity. °Emotional support °· Consider seeking support for your loss. Some forms of support that you might consider include your religious leader, friends, family, a professional counselor, or a bereavement support group. °General instructions °· Wear a supportive and well-fitting bra. °· If you pass a blood clot, save it and call your health care provider to discuss. Do not flush blood clots down the toilet before you get instructions from your health care provider. °· Keep all of your scheduled postpartum visits. At these visits, your health care provider will check to make sure that you are healing, both physically and emotionally. °Contact a health care provider if: °· You feel sad or depressed. °· You are having trouble eating or sleeping. °· You lose interest in activities you used to enjoy. °· You pass a blood clot from your vagina. °· You have pus or a bad-smelling discharge coming from   your wound or vagina. °· You have increasing redness, swelling, or pain in your perineal area. °· You feel pain or burning when you urinate. °· You urinate more often than normal. °· You have a fever. °· Your breasts become hard, red, or painful. °· You are dizzy or light-headed. °· You have a rash. °· You feel nauseous or you vomit. °· You have not had a menstrual period by the 12th week  after delivery. °Get help right away if: °· You have persistent pain that is not relieved by comfort measures or medicines. °· You have chest pain or trouble breathing. °· You have blurred vision or you see spots. °· You have a severe headache. °· You faint. °· You have sudden, severe leg pain. °· You bleed from your vagina so much that you fill more than one sanitary pad in one hour. Bleeding should not be heavier than your heaviest period. °· You have thoughts of hurting yourself. °If you ever feel like you may hurt yourself or others, or have thoughts about taking your own life, get help right away. You can go to your nearest emergency department or call: °· Your local emergency services (911 in the [place] °· A suicide crisis helpline, such as the National Suicide Prevention Lifeline at 1-800-273-8255. This is open 24 hours a day. °Summary °· Take over-the-counter and prescription medicines only as told by your health care provider. °· Return to your normal activities as told by your health care provider. Ask your health care provider what activities are safe for you. °· Consider getting support for your loss. Sources of support include religious leaders, friends, family, professional counselors, and bereavement support groups. °· Keep all follow-up visits as told by your health care provider. This is important. °This information is not intended to replace advice given to you by your health care provider. Make sure you discuss any questions you have with your health care provider. °Document Released: 09/11/2013 Document Revised: 05/12/2017 Document Reviewed: 08/06/2016 °Elsevier Patient Education © 2020 [employer] ° °

## 2018-12-20 NOTE — Plan of Care (Signed)
Pt admitted to labor and delivery for induction of labor for IUFD at 52 weeks. Pt labored with epidural and delivered at 0315. Pt pain managed and offered emotional support. Pt plans to be d/c today. All questions have been answered and pt verbalized understanding of plan of care.

## 2018-12-20 NOTE — Anesthesia Postprocedure Evaluation (Signed)
Anesthesia Post Note  Patient: Reyne Falconi  Procedure(s) Performed: AN AD [place]  Patient location during evaluation: L&D Anesthesia Type: Epidural Level of consciousness: awake, awake and alert and oriented Pain management: pain level controlled Vital Signs Assessment: post-procedure vital signs reviewed and stable Respiratory status: spontaneous breathing, nonlabored ventilation and respiratory function stable Cardiovascular status: blood pressure returned to baseline and stable Postop Assessment: no headache and no backache Anesthetic complications: no     Last Vitals:  Vitals:   12/20/18 0012 12/20/18 0721  BP: 131/71 116/77  Pulse: 61 70  Resp: 17 18  Temp: 36.8 C 37.1 C  SpO2:      Last Pain:  Vitals:   12/20/18 0721  TempSrc: Oral  PainSc: 0-No pain                 Johnna Acosta

## 2018-12-21 LAB — LUPUS ANTICOAGULANT PANEL
DRVVT: 33.6 s (ref 0.0–47.0)
PTT Lupus Anticoagulant: 27.6 s (ref 0.0–51.9)

## 2018-12-21 LAB — PARVOVIRUS B19 ANTIBODY, IGG AND IGM
Parovirus B19 IgG Abs: 5.3 index — ABNORMAL HIGH (ref 0.0–0.8)
Parovirus B19 IgM Abs: 0.1 index (ref 0.0–0.8)

## 2018-12-21 LAB — CARDIOLIPIN ANTIBODIES, IGG, IGM, IGA
Anticardiolipin IgA: <9 APL U/mL (ref 0–11)
Anticardiolipin IgG: <9 GPL U/mL (ref 0–14)
Anticardiolipin IgM: <9 MPL U/mL (ref 0–12)

## 2018-12-22 LAB — BPAM RBC
Blood Product Expiration Date: 202008302359
ISSUE DATE / TIME: 202008110800
Unit Type and Rh: 6200

## 2018-12-22 LAB — TYPE AND SCREEN
ABO/RH(D): A POS
Antibody Screen: POSITIVE
Unit division: 0
Unit division: 0

## 2018-12-23 LAB — SURGICAL PATHOLOGY

## 2019-01-02 LAB — POC/TISSUE MICROARRAY

## 2019-01-12 ENCOUNTER — Ambulatory Visit (HOSPITAL_BASED_OUTPATIENT_CLINIC_OR_DEPARTMENT_OTHER)
Admission: RE | Admit: 2019-01-12 | Discharge: 2019-01-12 | Disposition: A | Discharge status: Home / Self Care | Payer: Managed Care, Other (non HMO) | Source: Ambulatory Visit | Attending: Maternal & Fetal Medicine | Admitting: Maternal & Fetal Medicine

## 2019-01-12 ENCOUNTER — Ambulatory Visit
Admission: RE | Admit: 2019-01-12 | Discharge: 2019-01-12 | Disposition: A | Discharge status: Home / Self Care | Payer: Managed Care, Other (non HMO) | Source: Ambulatory Visit | Attending: Maternal & Fetal Medicine | Admitting: Maternal & Fetal Medicine

## 2019-01-12 ENCOUNTER — Other Ambulatory Visit: Payer: Self-pay

## 2019-01-12 DIAGNOSIS — Z8759 Personal history of other complications of pregnancy, childbirth and the puerperium: Secondary | ICD-10-CM | POA: Insufficient documentation

## 2019-01-12 NOTE — Progress Notes (Signed)
Ms. Girten was seen today at [facility] for an MFM consultation due to her history of a recent 62 weeks IUFD. See the MFM note for details and recommendations regarding future pregnancy management and history of cervical incompetence.  We obtained a detailed family history and pregnancy history. Ms. Udovich is not currently pregnant.  She had a 33 week IUFD last month after minimal prenatal care.  Ms. Jolly named the baby Miracle. She reported not knowing that she was pregnant until [redacted] weeks gestation.  She had four prior pregnancy losses (see MFM note) at 16 weeks, 23 weeks, 24 weeks and 5 weeks.  Her partner has two healthy children.  In the family history, there are no other individuals reported to have experience recurrent miscarriage or stillbirth.  There are also no persons reported with birth defects, intellectual disabilities or known genetic conditions. The patient denied consanguinity with the father of the pregnancy.  A review of the records from her recent pregnancy showed that the autopsy on Miracle is still pending.  A chromosomal microarray was performed and was normal, as follows:   .arr[hg19](1-22,X)x2     The whole genome chromosome SNP microarray (Reveal)  analysis was normal. No significant changes in the 2.695  million region specific SNP and structural targets were  detected within the thresholds and specifications indicated  below. No admixture of fetal and maternal DNA was noted in  this microarray analysis.  This result means that within the limits of the microarray, there were no areas of deletion or duplication of genetic material and no areas of homozygosity in the sample. The microarray cannot detect truly balanced chromosome translocations.  We discussed with Ms. Coover that chromosome translocation in a parent may be responsible for deletions or duplication of genetic material in a pregnancy that may result in loss.  However, when there  is no loss or gain of genetic material base upon microarray, this is less likely to be the cause for losses.  A chromosome analysis on Ms. Vensel could be considered if desired in the future, though we would expect the yield to be low given these results.  We will continue to look out for the autopsy results and speak with Ms. Zimmermann once those are available to help determine in any additional information which may be helpful.  Dr. Diamantina Monks met with Ms. Delorey and outlined the recommendations for future pregnancies due to the history of cervical incompetence and this loss.  See that note for details.  In addition, we would offer testing for chromosome conditions in any future pregnancy due to advanced maternal age.  Ms. Livengood is currently 38 years old and therefore is at increased risk for aneuploidy in any future pregnancy.  Screening as well as diagnostic testing would be made available in the first trimester.  The specific chance for a chromosome condition would increase with advancing maternal age and could be reviewed with Ms. Zani prior to or during any future pregnancy.    We encouraged Ms. Mcconaha to contact our office with questions or concerns if needed.  We may be reached at [phone].  Wilburt Finlay, MS, CGC

## 2019-01-12 NOTE — ED Notes (Signed)
Pt to [facility] today for scheduled appt.  Vitals: 125/93 Pulse 93 Resp 18 SPO2 97 RA Weight 167.5 Height 5'7

## 2019-01-12 NOTE — Progress Notes (Signed)
[facility] Consultation    HPI: [name] is a 38 y.o. [id number] who presents in consultation due to history of IUFD in August, 2020. She presented for antenatal care at 30 weeks because was not aware of pregnancy.  She did not have antenatal anatomic survey ultrasound prior to her diagnosis of IUFD at 64 weeks. She received a single dose of cytotec and delivered a nonanomalous female of 4lb 7 oz.  Her Kb was normal, HgbA1c 4.8, TSH normal, COVID-19 neg, APLAS neg, RPR NR, parvovirus IGG elevated, negative IGM.  Autopsy is pending.   Placental path:  - FETAL VASCULAR MALPERFUSION, HIGH-GRADE, ASSOCIATED WITH INTRAUTERINE  FETAL DEMISE.  - HYPER COILED THREE-VESSEL UMBILICAL CORD WITH MARGINAL INSERTION.  - THIRD TRIMESTER DISCOID PLACENTA WEIGHING 288 G (3RD PERCENTILE BASED  ON PROVIDED GESTATIONAL AGE).  - FETAL MEMBRANES WITH MILD CHORIOAMNIONITIS (MIR: GRADE 1, STAGE 1).  - MEMBRANOUS DECIDUA WITH HEMOSIDERIN LADEN MACROPHAGES CONSISTENT WITH  NON-ACUTE RETRO-MEMBRANOUS HEMORRHAGE.   Antibody screen: NEGATIVE FOR C ANTIGEN NEGATIVE FOR E ANTIGEN NEGATIVE FOR KELL ANTIGEN NEGATIVE FOR DUFFY A ANTIGEN NEGATIVE FOR DUFFY B ANTIGEN NEGATIVE FOR KIDD B ANTIGEN POSITIVE FOR c ANTIGEN POSITIVE FOR e ANTIGEN POSITIVE FOR KIDD A ANTIGEN POSITIVE FOR M ANTIGEN  POSITIVE FOR S ANTIGEN POSITIVE FOR s ANTIGEN   Her pregnancy history is significant for losses at 87, 23, and 24 weeks.  She had cerclage placement with her 24 week loss but presented to a community hospital in [place], [place] with advanced dilation and delivered shortly after arrival.     Past Medical History: negative Past Surgical History: negative Obstetric History:  OB History    Gravida  5   Para  4   Term      Preterm  4   AB  1   Living        SAB  1   TAB      Ectopic      Multiple  0   Live Births  1        Obstetric Comments  Has non-specific blood antibodies         Medications: None PMH Negative Allergies: Patient is allergic to sulfa antibiotics.  Social History: Patient 1 cigar/day and usually smokes with alcohol (beer) she denies other toxic habits.  She works in a [employer] (on [employer] 10h/daily).  Her family and friends are primarily in [place], [place] (home town).  She is not with the previous partner and is currently single.  Family History: family history includes Hypertension in her maternal grandmother.  .  Physical Exam: BP 125/93  Asessement/Plan  1. Intrauterine fetal demise at [redacted] weeks gestation.  We addressed common underlying etiologies of IUFD including placental dysfunction (placenta appears abnormal on pathology, however, no clear mention of abruption noted and fetus was normally grown), developmental anomaly --fetal autopsy pending, endocrine anomalies (diabetes and thyroid screening were normal), antiphospholipid antibody syndrome (she had normal testing), genetic disorders (normal microarray), and infectious processes (parvo immune and RPR negative).  She had the opportunity to meet with our genetic counselor today; please see that note for details.   --We reviewed the available information and lack of infromation (e.g autopsy).  The placental pathology does report 'hypoperfusion' of the placenta.  --I advised we would perform detailed anatomic survey in a future pregnancy and serial growth evaluations, as well as initiate weekly antenatal testing at 32 weeks. --I recommended she seek support from a counselor given  her risks for PTSD and depression/anxiety in the setting of her pregnancy losses and recent IUFD.  She reports she is planning to make appt with mental health provider  2. Cervical insufficiency---she had two, second trimester losses followed by a loss at 24 weeks in the setting of cerclage placement and delivery in a community hospital due to advanced dilation at presentation.  --I recommended cerclage  (shirodkar) in future pregnancy (12-14 weeks).  --consider evaluation of the uterine cavity (e.g. sonohysterogram) in non pregnant state.   3. AMA: we reviewed risks of aneuploidy associated with advancing gestational age as well as options for aneuploidy screening/testing. She met with our genetic counselor; please see that note for details.    4. Antibody screen: she has no history of blood transfusion and reported to have no sig antibodies.  We discussed performing repeat blood typing at time of subsequent pregnancy and obtaining titers for  any antibodies that could be associated with fetal anemia (e.g. anti E )  5. . Gen/Preconception counseling--we discussed PNV (or at least folic acid) taken 3 months prior to conception to decrease risk for birth defects like spina bifida. --she is not with the same partner and we addressed option of sperm donation --she plans to stop smoking, alcohol prior to conception --she would like to review final autopsy report and any other information available prior  --we discussed taking aspirin after 12 weeks during pregnancy to reduce risk for preeclampsia    Total time spent with the patient was 30 minutes with greater than 50% spent in counseling and coordination of care. We appreciate this interesting consult and will be happy to be involved in the ongoing care of Patricia Sheppard in anyway her obstetricians desire.  Manfred Shirts, MD [facility]

## 2019-02-27 IMAGING — US US OB < 14 WEEKS - US OB TV
1 series · 13 of 28 positions shown · non-contrast
Comparison: None.

CLINICAL DATA: Pregnant patient with vaginal bleeding.

EXAM:
OBSTETRIC <14 WK US AND TRANSVAGINAL OB US
TECHNIQUE: Both transabdominal and transvaginal ultrasound examinations were
performed for complete evaluation of the gestation as well as the
maternal uterus, adnexal regions, and pelvic cul-de-sac.
Transvaginal technique was performed to assess early pregnancy.

[Series 1: us ob < 14 weeks - us ob tv · 0.14mm/px · 13 of 109 slices shown]
[im 5/109]
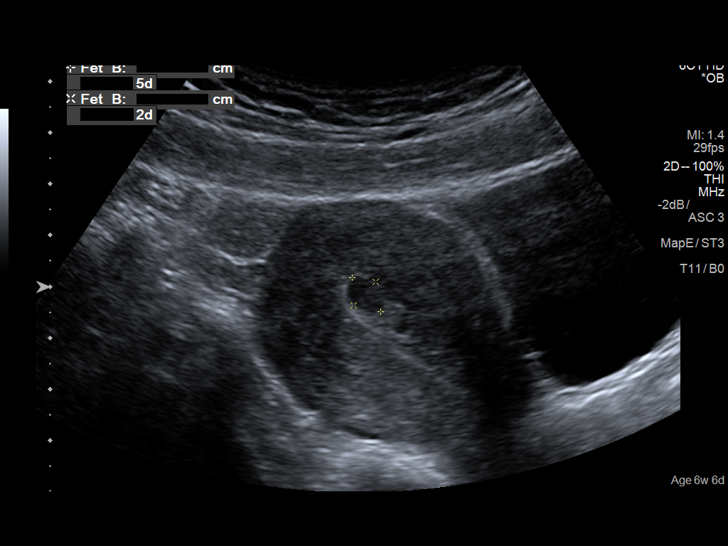
[im 13/109]
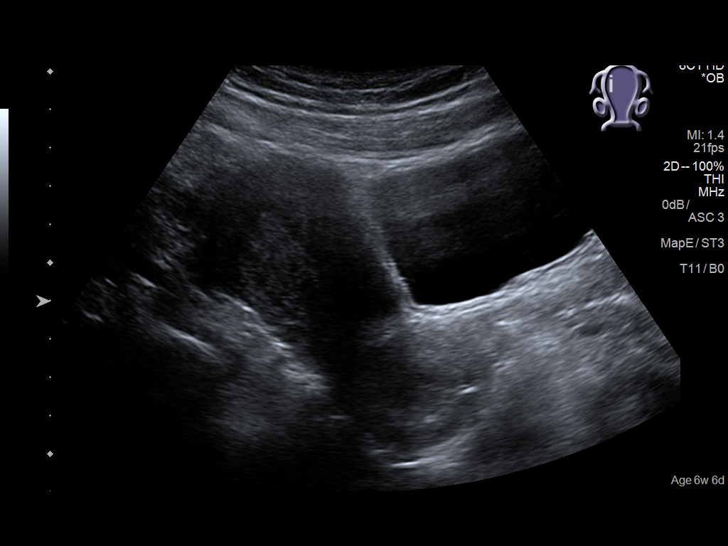
[im 21/109]
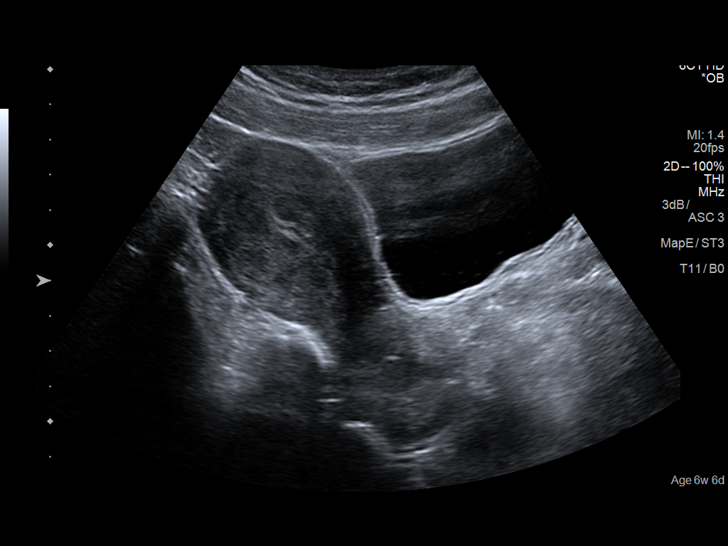
[im 29/109]
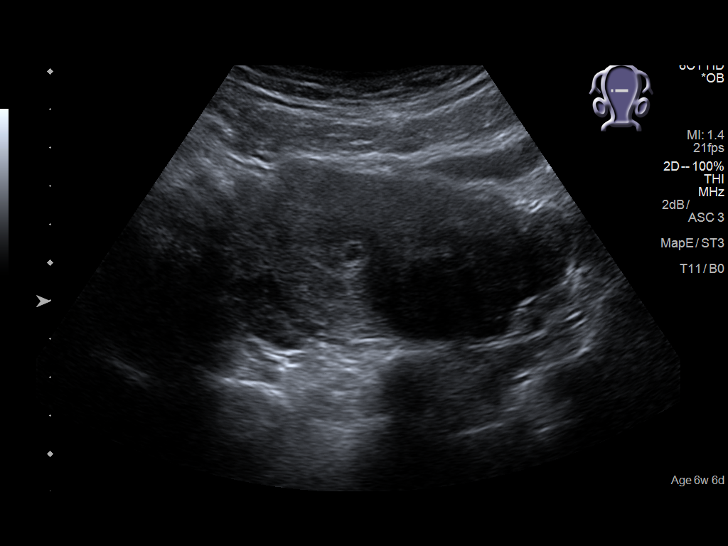
[im 37/109]
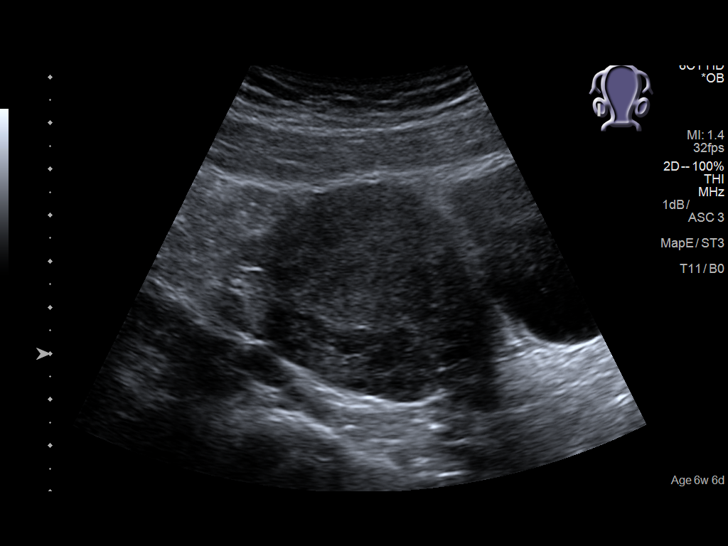
[im 45/109]
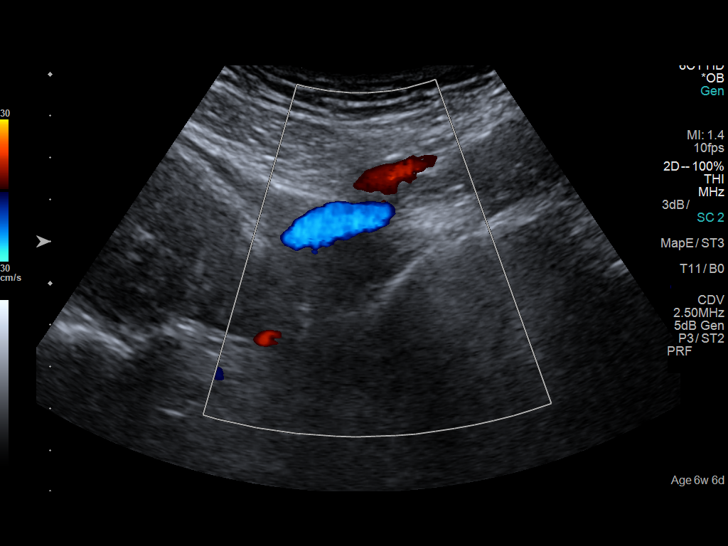
[im 57/109]
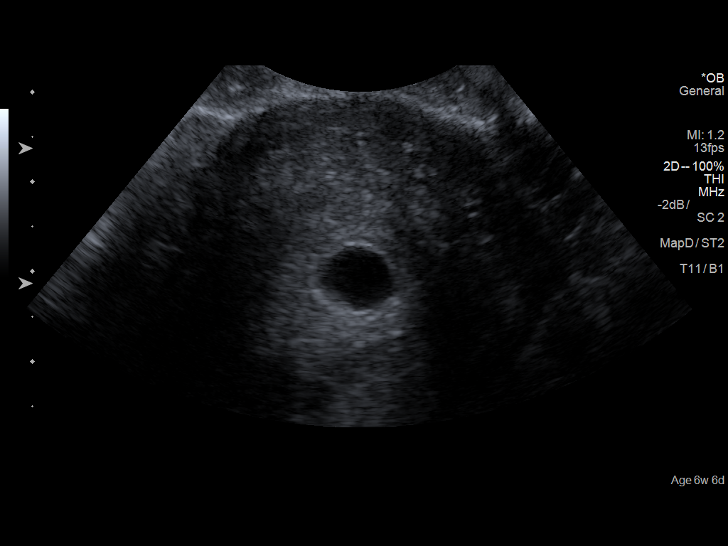
[im 65/109]
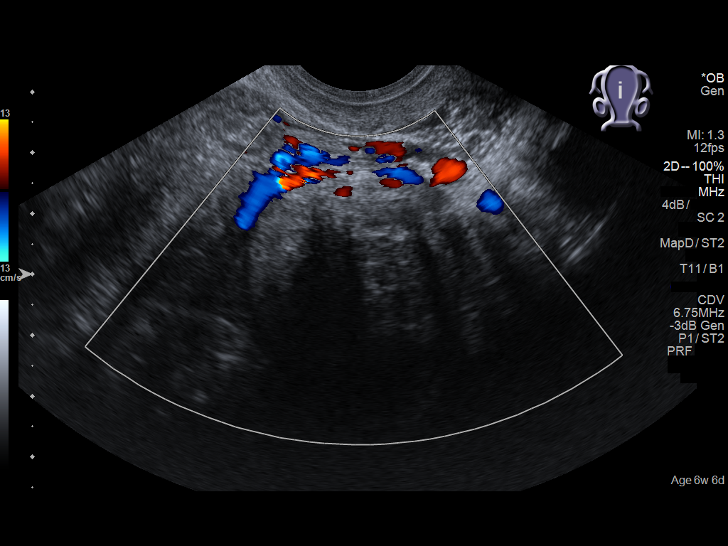
[im 73/109]
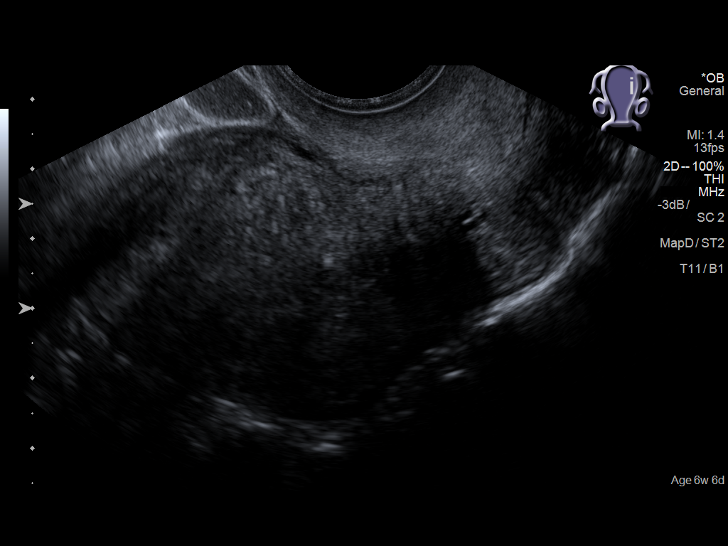
[im 81/109]
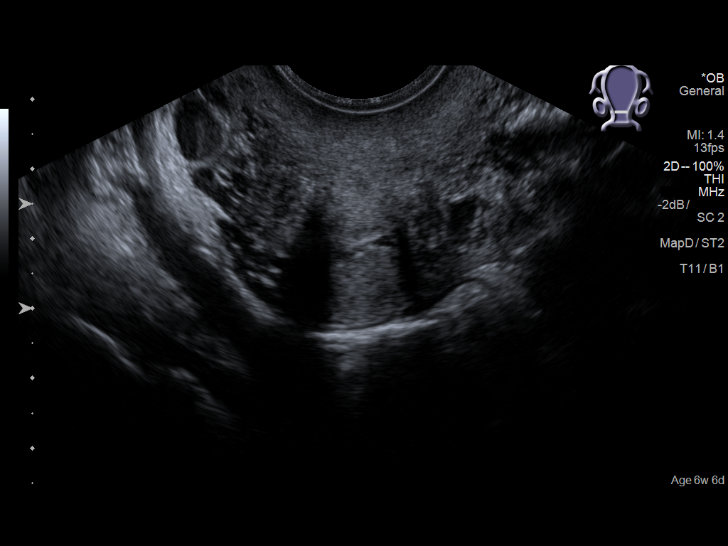
[im 89/109]
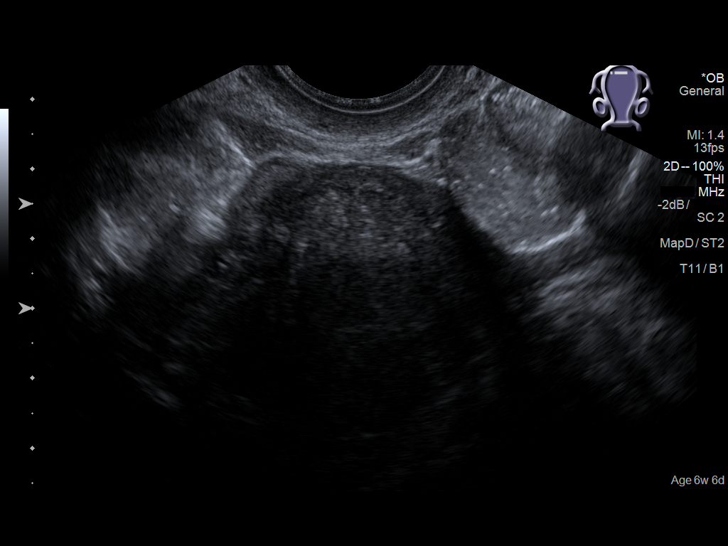
[im 97/109]
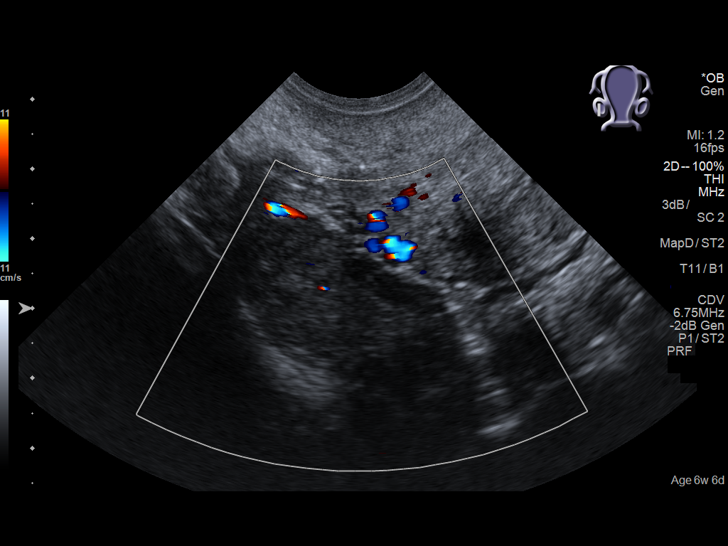
[im 105/109]
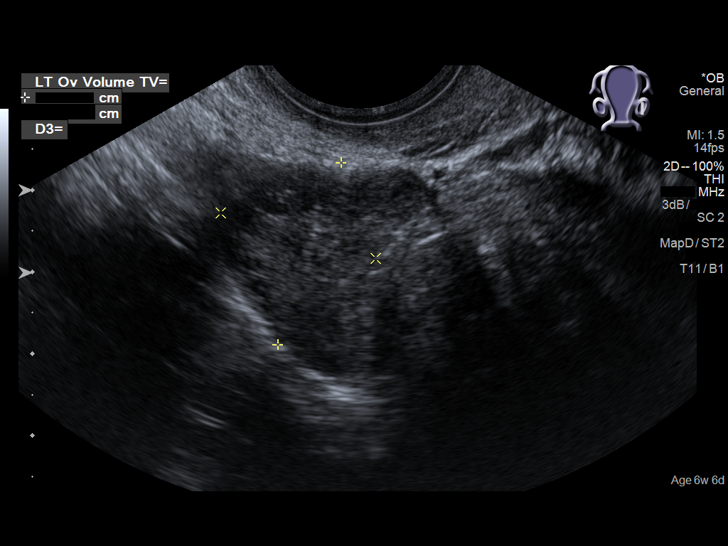

[13 of 28 positions shown; findings below may reference images not displayed]

FINDINGS: Intrauterine gestational sac: Possible 2 gestational sacs versus
bilobed gestational sac.

Yolk sac:  Not Visualized.

Embryo:  Not Visualized.

Cardiac Activity: Not Visualized.

MSD: 5.4  mm   5 w   2  d

Subchorionic hemorrhage:  None visualized.

Maternal uterus/adnexae: Normal right and left ovaries. Large
fibroid within the left aspect of the uterus measuring 4.0 x 4.1 x
3.7 cm. No free fluid in the pelvis.
IMPRESSION: There are either two adjacent intrauterine gestational sacs without
definitive yolk sacs (twin gestation) or potentially a bilobed
appearing intrauterine gestational sac. Recommend follow-up
quantitative B-HCG levels and follow-up US in 14 days to assess
viability. This recommendation follows SRU consensus guidelines:
Diagnostic Criteria for Nonviable Pregnancy Early in the First
Trimester. N Engl J Med 2744; [DATE].

Fibroid uterus.

## 2019-10-30 IMAGING — US LIMITED OBSTETRIC ULTRASOUND
1 series · 14 of 23 positions shown · non-contrast
Comparison: none

CLINICAL DATA: Fetal demise.

EXAM:
LIMITED OBSTETRIC ULTRASOUND

[Series 1: limited obstetric ultrasound · 23 acquisitions, 14 frames shown]
[im 1/23]
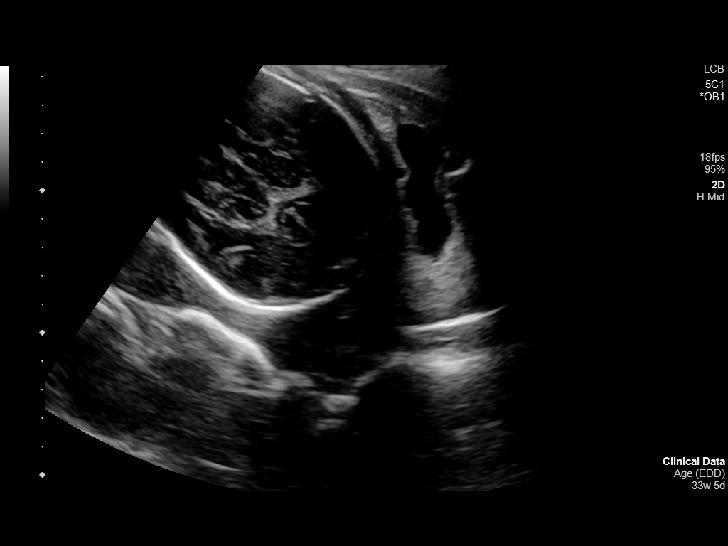
[im 3/23]
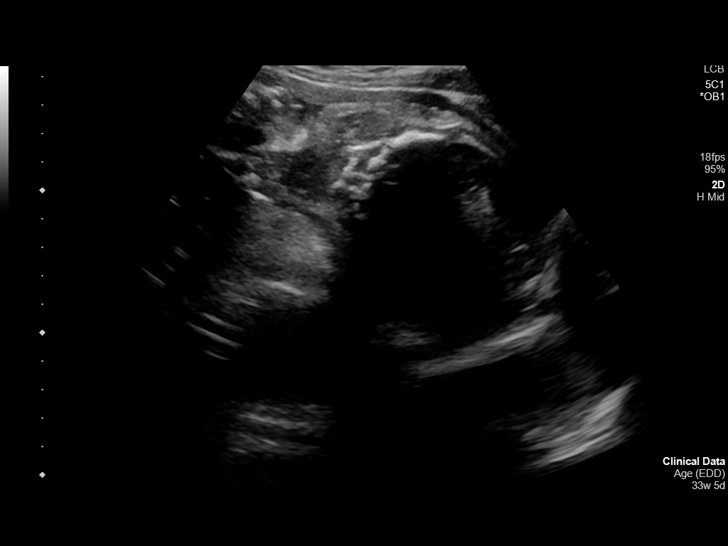
[im 5/23]
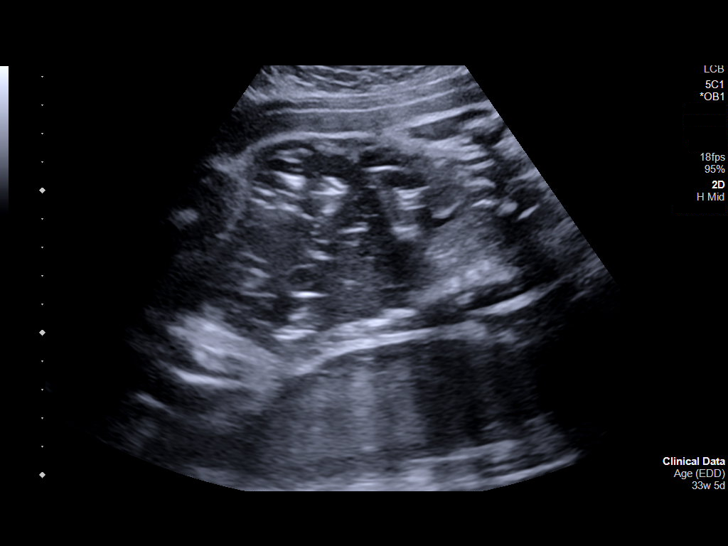
[im 6/23]
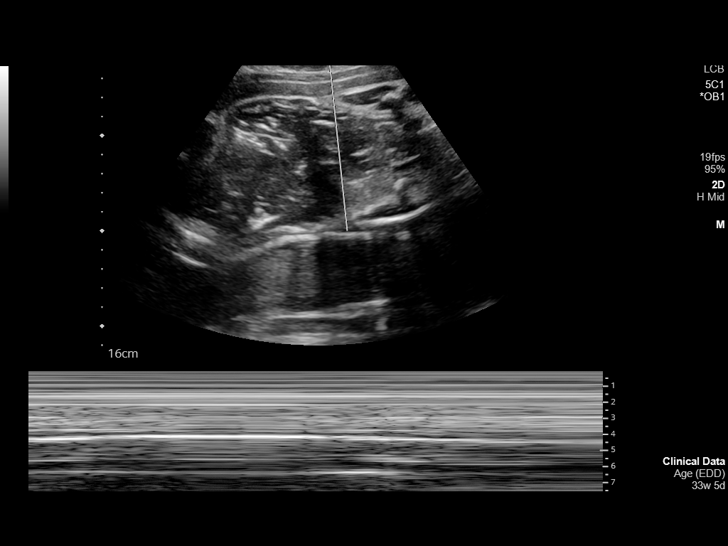
[im 8/23]
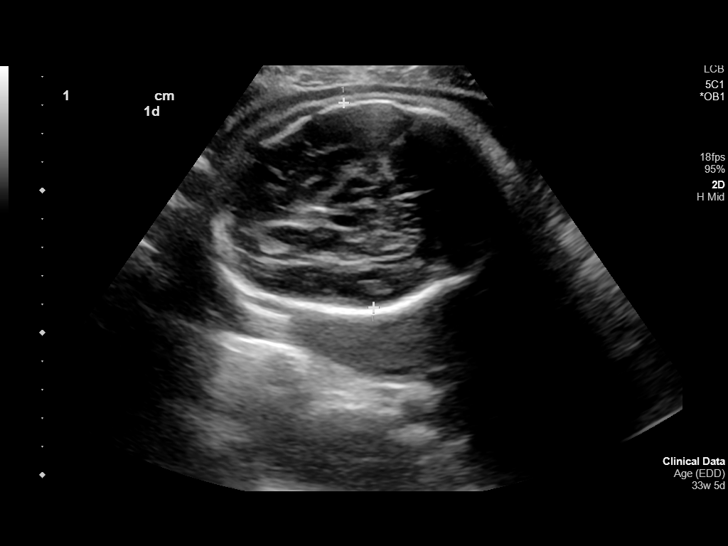
[im 10/23]
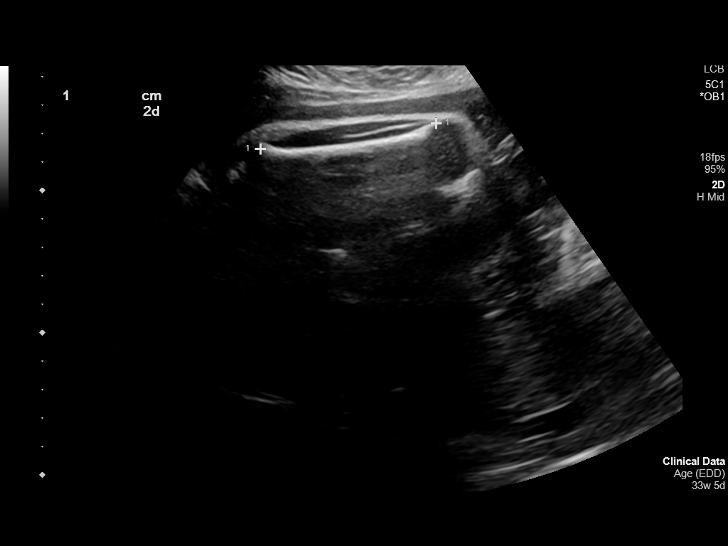
[im 11/23]
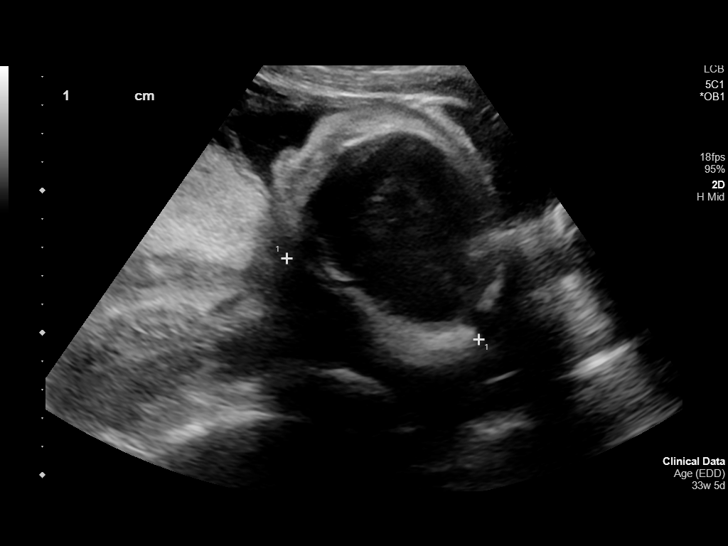
[im 13/23]
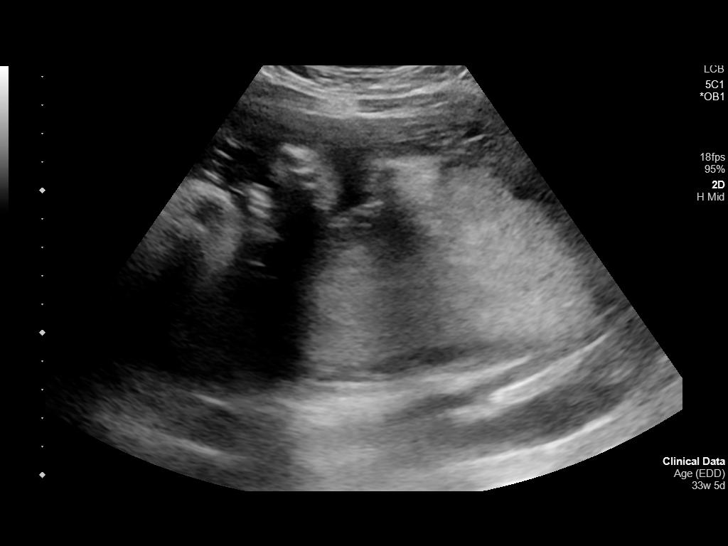
[im 14/23]
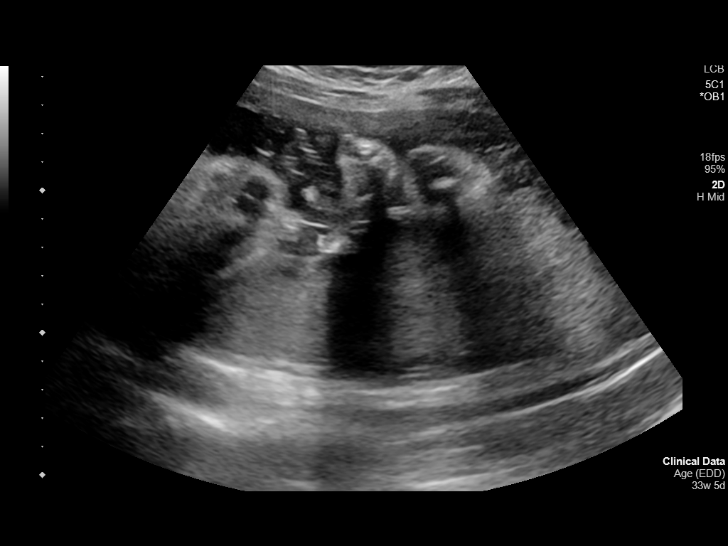
[im 16/23]
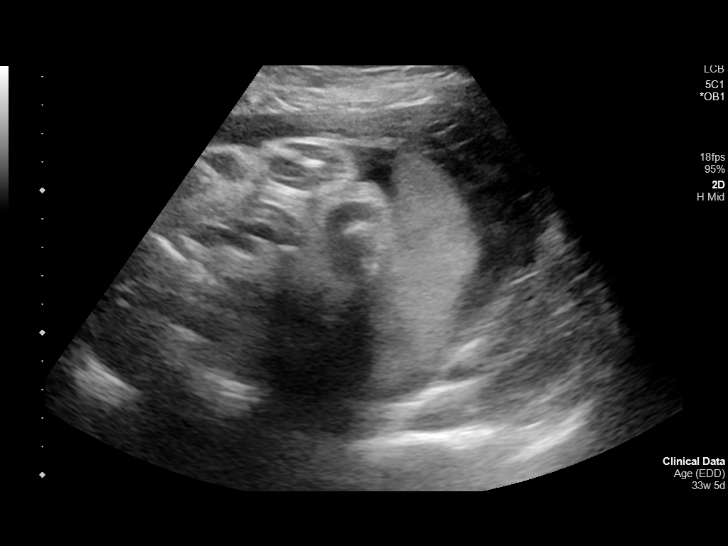
[im 18/23]
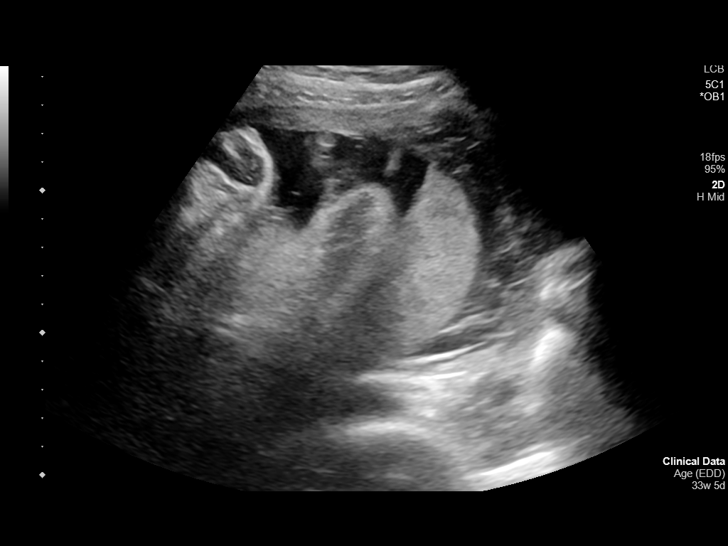
[im 19/23]
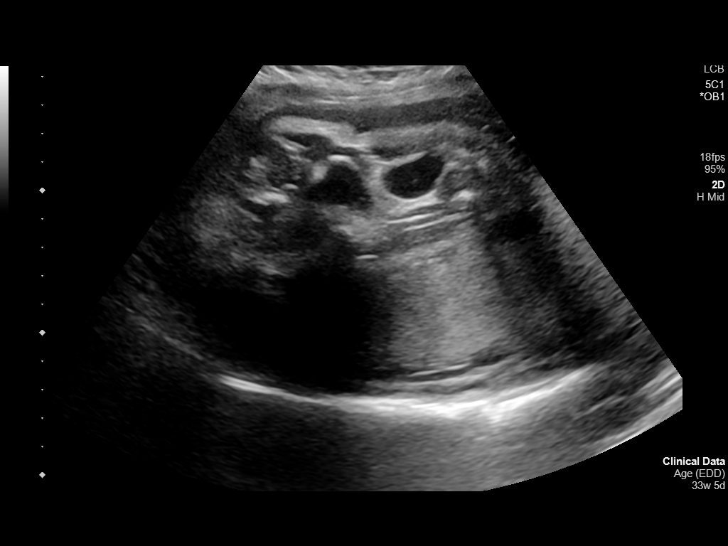
[im 21/23]
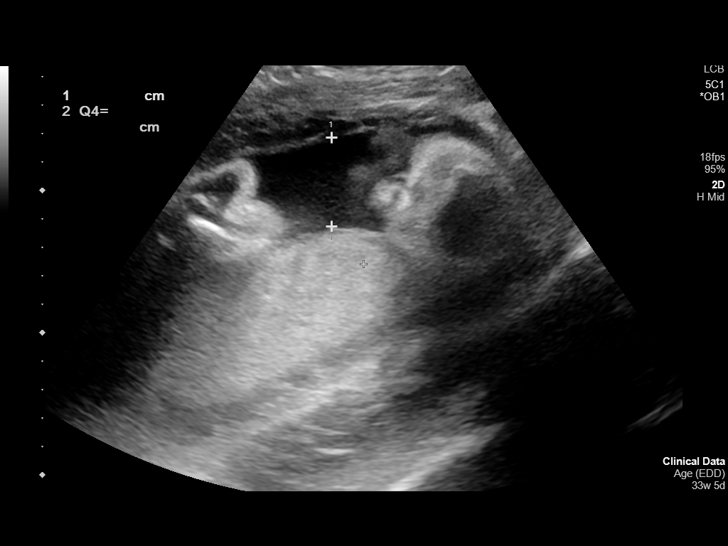
[im 23/23]
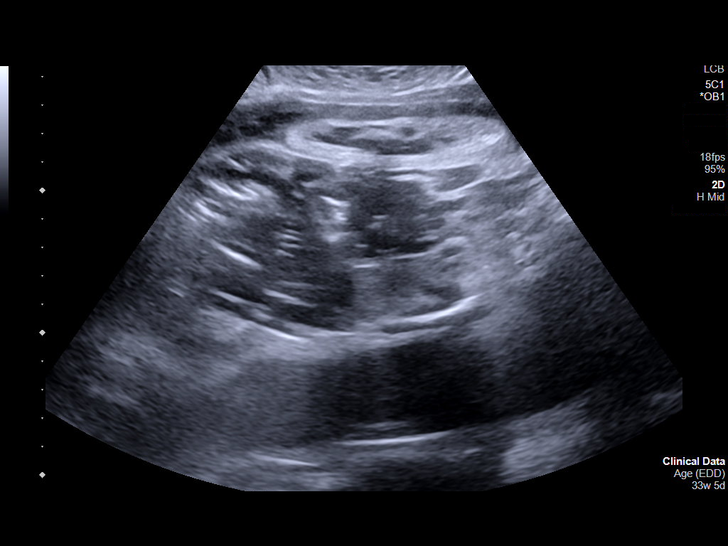

[14 of 23 positions shown; findings below may reference images not displayed]

FINDINGS: Number of Fetuses: 1

Heart Rate: Absent fetal heart rate

Movement: Absent

Presentation: Cephalic

Placental Location: Left-sided.

Previa: Absent

Amniotic Fluid (Subjective):  Decreased

AFI: 8.6 cm cm

BPD: 7.2 cm 29 w  1 d

MATERNAL FINDINGS:

Cervix:  Appears closed.

Uterus/Adnexae: No abnormality visualized.
IMPRESSION: Absence of fetal heart rate and movement, consistent with fetal
demise.

This exam is performed on an emergent basis and does not
comprehensively evaluate fetal size, dating, or anatomy; follow-up
complete OB US should be considered if further fetal assessment is
warranted.

## 2021-03-26 ENCOUNTER — Other Ambulatory Visit: Payer: Self-pay

## 2021-03-26 ENCOUNTER — Ambulatory Visit
Admission: EM | Admit: 2021-03-26 | Discharge: 2021-03-26 | Disposition: A | Discharge status: Home / Self Care | Payer: Managed Care, Other (non HMO) | Attending: Internal Medicine | Admitting: Internal Medicine

## 2021-03-26 DIAGNOSIS — O34593 Maternal care for other abnormalities of gravid uterus, third trimester: Secondary | ICD-10-CM | POA: Diagnosis present

## 2021-03-26 DIAGNOSIS — R252 Cramp and spasm: Secondary | ICD-10-CM | POA: Insufficient documentation

## 2021-03-26 LAB — PREGNANCY, URINE: Preg Test, Ur: POSITIVE — AB

## 2021-03-26 MED ORDER — PRENATAL 19 29-1 MG PO CHEW: CHEWABLE_TABLET | ORAL | 0 refills | Status: AC

## 2021-03-26 NOTE — ED Triage Notes (Addendum)
Patient presents to Urgent Care with complaints of leg cramps x 2 weeks and vomiting since Sunday. Not treating vomiting with meds. Pt states she had a positive pregnancy test yesterday. Here for confirmation.   Denies fever.

## 2021-03-26 NOTE — ED Provider Notes (Signed)
[facility]    CSN: [id number] Arrival date & time: 03/26/21  0820      History   Chief Complaint Chief Complaint  Patient presents with   Emesis    HPI Patricia Sheppard is a 40 y.o. female who presents with leg craps x 2 weeks, and started vomiting 3 days ago. LMP 08/2020 and spotted one day in May, and none since. She is high risk due to have had 5 miscarriages and no live births.  Vomited once Sund and once  Monday.  No UTI symptoms or URI symptoms.  She has been in denial of being pregnant noticing her abdomen enlargement. Has been feeling movement. Denies consumption of alcohol or drug use.  Has bilateral posterior distal thigh cramps when gets up from sitting, does not bother her at night time when she sleeps.   Was told she needs confirmation of pregnancy from a clinic  before she can have OB apt.    History reviewed. No pertinent past medical history.  Patient Active Problem List   Diagnosis Date Noted   History of IUFD    Fetal demise, greater than 22 weeks, antepartum 12/18/2018   Abnormal antibody titer 12/09/2018    Past Surgical History:  Procedure Laterality Date   NO PAST SURGERIES      OB History     Gravida  5   Para  4   Term      Preterm  4   AB  1   Living         SAB  1   IAB      Ectopic      Multiple  0   Live Births  1        Obstetric Comments  Has non-specific blood antibodies          Home Medications    Prior to Admission medications   Medication Sig Start Date End Date Taking? Authorizing Provider  Prenatal Vit-Fe Fumarate-FA (PRENATAL 19) 29-1 MG CHEW One daily 03/26/21  Yes Rodriguez-Southworth, Nettie Elm, PA-C    Family History Family History  Problem Relation Age of Onset   Hypertension Maternal Grandmother     Social History Social History   Tobacco Use   Smoking status: Former    Types: Cigars    Quit date: 04/05/2017    Years since quitting: 3.9   Smokeless tobacco: Never   Vaping Use   Vaping Use: Never used  Substance Use Topics   Alcohol use: Not Currently   Drug use: No     Allergies   Sulfa antibiotics   Review of Systems Review of Systems  Constitutional:  Negative for chills, diaphoresis and fever.  HENT:  Negative for congestion.   Respiratory:  Negative for cough.   Gastrointestinal:  Positive for nausea and vomiting. Negative for abdominal pain and diarrhea.  Genitourinary:  Negative for dysuria and frequency.  Skin:  Negative for rash.    Physical Exam Triage Vital Signs ED Triage Vitals  Enc Vitals Group     BP 03/26/21 0927 114/76     Pulse Rate 03/26/21 0927 86     Resp 03/26/21 0927 16     Temp 03/26/21 0927 98.7 F (37.1 C)     Temp Source 03/26/21 0927 Oral     SpO2 03/26/21 0927 98 %     Weight --      Height --      Head Circumference --      Peak  Flow --      Pain Score 03/26/21 0925 0     Pain Loc --      Pain Edu? --      Excl. in GC? --    No data found.  Updated Vital Signs BP 114/76 (BP Location: Left Arm)   Pulse 86   Temp 98.7 F (37.1 C) (Oral)   Resp 16   LMP 09/12/2020 (Approximate) Comment: Irregular periods  SpO2 98%   Visual Acuity Right Eye Distance:   Left Eye Distance:   Bilateral Distance:    Right Eye Near:   Left Eye Near:    Bilateral Near:     Physical Exam Vitals and nursing note reviewed.  Constitutional:      General: She is not in acute distress.    Appearance: She is not toxic-appearing.  HENT:     Head: Normocephalic.     Right Ear: External ear normal.     Left Ear: External ear normal.  Eyes:     Conjunctiva/sclera: Conjunctivae normal.  Cardiovascular:     Rate and Rhythm: Normal rate and regular rhythm.  Pulmonary:     Effort: Pulmonary effort is normal.     Breath sounds: Normal breath sounds.  Abdominal:     Tenderness: There is no abdominal tenderness.     Comments: Fundus measures to mid upper abdomen region   Musculoskeletal:        General: Normal  range of motion.     Cervical back: Neck supple.     Comments: Has local tenderness on distal hamstring with palpation, but not on L. There are no indurations or masses. Pain was provoked with hamstring muscle testing.   Skin:    General: Skin is warm and dry.     Findings: No rash.  Neurological:     Mental Status: She is alert and oriented to person, place, and time.     Gait: Gait normal.  Psychiatric:        Mood and Affect: Mood normal.        Behavior: Behavior normal.        Thought Content: Thought content normal.        Judgment: Judgment normal.     UC Treatments / Results  Labs (all labs ordered are listed, but only abnormal results are displayed) Labs Reviewed  PREGNANCY, URINE - Abnormal; Notable for the following components:      Result Value   Preg Test, Ur POSITIVE (*)    All other components within normal limits    EKG   Radiology No results found.  Procedures Procedures (including critical care time)  Medications Ordered in UC Medications - No data to display  Initial Impression / Assessment and Plan / UC Course  I have reviewed the triage vital signs and the nursing notes. Pertinent labs  results that were available during my care of the patient were reviewed by me and considered in my medical decision making (see chart for details). Thigh cramps which seem to be from her quad muscle.  I sent prenatal vitamins.  At the end of the visit she looked at her callander and thinks her last period was April 20th. See instructions.  Final Clinical Impressions(s) / UC Diagnoses   Final diagnoses:  Bilateral leg cramps  Abnormality of uterus during pregnancy in third trimester     Discharge Instructions      Consume foods high is potasium and magnesium  See your OB doctor this  month.  You are 30 weeks pregnancy and your due date is 06/04/2021      ED Prescriptions     Medication Sig Dispense Auth. Provider   Prenatal Vit-Fe Fumarate-FA  (PRENATAL 19) 29-1 MG CHEW One daily 90 tablet Rodriguez-Southworth, Nettie Elm, PA-C      PDMP not reviewed this encounter.   Garey Ham, PA-C 03/26/21 1220

## 2021-03-26 NOTE — Discharge Instructions (Addendum)
Consume foods high is potasium and magnesium  See your OB doctor this month.  You are 30 weeks pregnancy and your due date is 06/04/2021

## 2021-03-27 ENCOUNTER — Other Ambulatory Visit: Payer: Self-pay | Admitting: Certified Nurse Midwife

## 2021-03-27 ENCOUNTER — Other Ambulatory Visit: Payer: Self-pay

## 2021-03-27 ENCOUNTER — Ambulatory Visit
Admission: RE | Admit: 2021-03-27 | Discharge: 2021-03-27 | Disposition: A | Discharge status: Home / Self Care | Payer: Managed Care, Other (non HMO) | Source: Ambulatory Visit | Attending: Certified Nurse Midwife | Admitting: Certified Nurse Midwife

## 2021-03-27 DIAGNOSIS — O2620 Pregnancy care for patient with recurrent pregnancy loss, unspecified trimester: Secondary | ICD-10-CM

## 2021-03-28 DIAGNOSIS — O09892 Supervision of other high risk pregnancies, second trimester: Secondary | ICD-10-CM | POA: Insufficient documentation

## 2021-03-28 DIAGNOSIS — O09893 Supervision of other high risk pregnancies, third trimester: Secondary | ICD-10-CM | POA: Insufficient documentation

## 2021-03-31 ENCOUNTER — Other Ambulatory Visit: Payer: Self-pay

## 2021-03-31 ENCOUNTER — Observation Stay
Admission: EM | Admit: 2021-03-31 | Discharge: 2021-03-31 | Disposition: A | Discharge status: Home / Self Care | Payer: Managed Care, Other (non HMO) | Attending: Obstetrics and Gynecology | Admitting: Obstetrics and Gynecology

## 2021-03-31 DIAGNOSIS — O99333 Smoking (tobacco) complicating pregnancy, third trimester: Secondary | ICD-10-CM | POA: Insufficient documentation

## 2021-03-31 DIAGNOSIS — F17290 Nicotine dependence, other tobacco product, uncomplicated: Secondary | ICD-10-CM | POA: Insufficient documentation

## 2021-03-31 DIAGNOSIS — Z3A28 28 weeks gestation of pregnancy: Secondary | ICD-10-CM | POA: Diagnosis not present

## 2021-03-31 DIAGNOSIS — O368130 Decreased fetal movements, third trimester, not applicable or unspecified: Secondary | ICD-10-CM | POA: Diagnosis present

## 2021-03-31 NOTE — OB Triage Note (Signed)
Pt presents c/o decreased fetal movement this morning. Last fetal movement was felt last night. Pt also states that she has been getting leg cramps the last month that make it hard for her to get up out the bed. Pt states cramps start in her thigh and go all the way don her calf. Pt denies bleeding or LOF. VSS. Will continue to monitor.

## 2021-03-31 NOTE — Discharge Summary (Signed)
Patricia Sheppard is a 40 y.o. female. She is at [redacted]w[redacted]d gestation. Patient's last menstrual period was 09/12/2020 (approximate). Estimated Date of Delivery: 06/19/21  Prenatal care site: [facility]  Chief complaint: Decreased FM  HPI: Patricia Sheppard presents to L&D with complaints of decreased FM  Factors complicating pregnancy: Late entry into prenatal care Previous adverse pregnancy outcomes  History of IUFD G5 - No prenatal care, IUFD at 33 weeks at initial encounter  AMA History of multiple antibodies 2020: POSITIVE for c, e, Kidd A, M, S and s.  Cerclage with G3-delivery at 23 weeks  S: Resting comfortably. no CTX, no VB.no LOF,  Active fetal movement.   Maternal Medical History:  Past Medical Hx:  has no past medical history on file.    Past Surgical Hx:  has a past surgical history that includes No past surgeries.   Allergies  Allergen Reactions   Sulfa Antibiotics Rash     Prior to Admission medications   Medication Sig Start Date End Date Taking? Authorizing Provider  Prenatal Vit-Fe Fumarate-FA (PRENATAL 19) 29-1 MG CHEW One daily 03/26/21  Yes Rodriguez-Southworth, Nettie Elm, PA-C    Social History: She  reports that she has been smoking cigars. She has never used smokeless tobacco. She reports that she does not currently use alcohol. She reports that she does not use drugs.  Family History: family history includes Hypertension in her maternal grandmother. ,no history of gyn cancers  Review of Systems: A full review of systems was performed and negative except as noted in the HPI.    O:  BP 109/68 (BP Location: Right Arm)   Pulse 79   Temp 98.8 F (37.1 C) (Oral)   Resp 16   Ht 5\' 7"  (1.702 m)   Wt 84.4 kg   LMP 09/12/2020 (Approximate) Comment: Irregular periods  SpO2 100%   BMI 29.13 kg/m  No results found for this or any previous visit (from the past 48 hour(s)).   Constitutional: NAD, AAOx3  HE/ENT: extraocular movements grossly intact, moist  mucous membranes CV: RRR PULM: nl respiratory effort, CTABL Abd: gravid, non-tender, non-distended, soft  Ext: Non-tender, Nonedmeatous Psych: mood appropriate, speech normal Pelvic : deferred SVE:     Fetal Monitor: Baseline: 135 bpm Variability: moderate Accels: Present Decels: none Toco: none  Category: I   Assessment: 40 y.o. [redacted]w[redacted]d here for antenatal surveillance during pregnancy.  Principle diagnosis: decreased fetal movement There were no encounter diagnoses.   Plan: Labor: not present.  Fetal Wellbeing: Reassuring Cat 1 tracing. Reactive NST  D/c home stable, precautions reviewed, follow-up as scheduled.   ----- [redacted]w[redacted]d, CNM Certified Nurse Midwife [facility]

## 2021-03-31 NOTE — Progress Notes (Signed)
Pt discharged home per F.Dickerson,CNM order.  Pt stable and ambulatory and an After Visit Summary was printed and given to the patient. Discharge education completed with patient/family including follow up instructions, appointments, and medication list. Pt received labor and bleeding precautions. Patient able to verbalize understanding, all questions fully answered upon discharge. Patient instructed to return to ED, call 911, or call MD for any changes in condition. Pt discharged home via personal vehicle with support person.

## 2021-04-01 LAB — OB RESULTS CONSOLE HIV ANTIBODY (ROUTINE TESTING): HIV: NONREACTIVE

## 2021-04-02 LAB — OB RESULTS CONSOLE HEPATITIS B SURFACE ANTIGEN: Hepatitis B Surface Ag: NEGATIVE

## 2021-04-03 LAB — OB RESULTS CONSOLE VARICELLA ZOSTER ANTIBODY, IGG: Varicella: IMMUNE

## 2021-04-03 LAB — OB RESULTS CONSOLE RUBELLA ANTIBODY, IGM: Rubella: IMMUNE

## 2021-04-03 LAB — OB RESULTS CONSOLE RPR: RPR: NONREACTIVE

## 2021-04-08 DIAGNOSIS — D573 Sickle-cell trait: Secondary | ICD-10-CM | POA: Insufficient documentation

## 2021-04-30 DIAGNOSIS — O09523 Supervision of elderly multigravida, third trimester: Secondary | ICD-10-CM | POA: Insufficient documentation

## 2021-04-30 DIAGNOSIS — N96 Recurrent pregnancy loss: Secondary | ICD-10-CM | POA: Insufficient documentation

## 2021-05-11 NOTE — L&D Delivery Note (Addendum)
Delivery Note  Patricia Sheppard is a [id number] at [redacted]w[redacted]d with an unknown LMP, not consistent with [place] at [redacted]w[redacted]d.   First Stage: Labor onset: 0115 Augmentation:  None  Analgesia /Anesthesia intrapartum: None  SROM at 0115 GBS: negative IP Antibiotics: not indicated   Second Stage: Complete dilation at 0302 Onset of pushing at 0302 FHR second stage 115 bpm with moderate variability - monitors removed for transportation from triage to LDR 2.  Infant crowning on arrival to LDR 2, unable to replace EFM  [name] presented to L&D with complaints of leakage of amniotic fluid.  She started contracting while in triage and they quickly became more frequent and intense.  She progressed rapidly from 3-4cm to 7-8/100/+2.  [name] was moved from triage to a labor room via [facility] with CNM.  She reported an urge to push as she arrived in the labor room and infant was crowning.  She pushed one time for a quick spontaneous vaginal birth.  Delivery of a viable baby boy on 06/07/2021 at 00302 by CNM Delivery of fetal head in OA position with restitution to LOT. Loose nuchal cord x 1;  Anterior then posterior shoulders delivered easily with gentle downward traction. Baby placed on mom's abdomen, and attended to by baby RN.   Mom and baby assisted to bed from wheelchair.   Cord double clamped after cessation of pulsation, cut by friend.  Cord blood sample collection: Not Indicated PENDING  Third Stage: Oxytocin bolus started after delivery of infant for hemorrhage prophylaxis  Placenta delivered intact with 3 VC @ 0311 Placenta disposition: to pathology  Uterine tone firm / bleeding small  Right labial laceration identified - hemostatic, approximates well Anesthesia for repair: N/A Repair: Not indicated Est. Blood Loss (mL): 50 ml  Complications: Precipitous labor  Mom to postpartum.  Baby to Couplet care / Skin to Skin.  Newborn: Information for the patient's newborn:  Leilanie, Rauda Boy [[id number]]  Live  born female "Brandyn" Birth Weight:  Pending  APGAR: 8, 9  Newborn Delivery   Birth date/time: 06/07/2021 03:02:00 Delivery type: Vaginal, Spontaneous     Feeding planned: bottle   ---------- Margaretmary Eddy, CNM Certified Nurse Midwife [facility]

## 2021-05-28 LAB — OB RESULTS CONSOLE GBS: GBS: NEGATIVE

## 2021-06-03 LAB — OB RESULTS CONSOLE GC/CHLAMYDIA
Chlamydia: NEGATIVE
Gonorrhea: NEGATIVE

## 2021-06-07 ENCOUNTER — Inpatient Hospital Stay
Admission: EM | Admit: 2021-06-07 | Discharge: 2021-06-09 | DRG: 807 | Disposition: A | Discharge status: Home / Self Care | Payer: Managed Care, Other (non HMO) | Attending: Obstetrics | Admitting: Obstetrics

## 2021-06-07 ENCOUNTER — Other Ambulatory Visit: Payer: Self-pay

## 2021-06-07 ENCOUNTER — Encounter: Payer: Self-pay | Admitting: Obstetrics and Gynecology

## 2021-06-07 DIAGNOSIS — Z20822 Contact with and (suspected) exposure to covid-19: Secondary | ICD-10-CM | POA: Diagnosis present

## 2021-06-07 DIAGNOSIS — D573 Sickle-cell trait: Secondary | ICD-10-CM | POA: Diagnosis present

## 2021-06-07 DIAGNOSIS — O99334 Smoking (tobacco) complicating childbirth: Secondary | ICD-10-CM | POA: Diagnosis present

## 2021-06-07 DIAGNOSIS — O9902 Anemia complicating childbirth: Secondary | ICD-10-CM | POA: Diagnosis present

## 2021-06-07 DIAGNOSIS — Z3A36 36 weeks gestation of pregnancy: Secondary | ICD-10-CM

## 2021-06-07 DIAGNOSIS — O1414 Severe pre-eclampsia complicating childbirth: Secondary | ICD-10-CM | POA: Diagnosis present

## 2021-06-07 DIAGNOSIS — O4290 Premature rupture of membranes, unspecified as to length of time between rupture and onset of labor, unspecified weeks of gestation: Secondary | ICD-10-CM | POA: Diagnosis present

## 2021-06-07 DIAGNOSIS — O368130 Decreased fetal movements, third trimester, not applicable or unspecified: Secondary | ICD-10-CM | POA: Diagnosis present

## 2021-06-07 DIAGNOSIS — O1490 Unspecified pre-eclampsia, unspecified trimester: Secondary | ICD-10-CM | POA: Diagnosis present

## 2021-06-07 DIAGNOSIS — O09299 Supervision of pregnancy with other poor reproductive or obstetric history, unspecified trimester: Secondary | ICD-10-CM | POA: Insufficient documentation

## 2021-06-07 DIAGNOSIS — F17290 Nicotine dependence, other tobacco product, uncomplicated: Secondary | ICD-10-CM | POA: Diagnosis present

## 2021-06-07 LAB — URINALYSIS, COMPLETE (UACMP) WITH MICROSCOPIC
Bilirubin Urine: NEGATIVE
Glucose, UA: NEGATIVE mg/dL
Nitrite: NEGATIVE
Protein, ur: 30 mg/dL — AB
Specific Gravity, Urine: 1.015 (ref 1.005–1.030)
pH: 6.5 (ref 5.0–8.0)

## 2021-06-07 LAB — CBC
HCT: 33.8 % — ABNORMAL LOW (ref 36.0–46.0)
HCT: 34.5 % — ABNORMAL LOW (ref 36.0–46.0)
Hemoglobin: 11.2 g/dL — ABNORMAL LOW (ref 12.0–15.0)
Hemoglobin: 11.6 g/dL — ABNORMAL LOW (ref 12.0–15.0)
MCH: 31.9 pg (ref 26.0–34.0)
MCH: 32.4 pg (ref 26.0–34.0)
MCHC: 33.1 g/dL (ref 30.0–36.0)
MCHC: 33.6 g/dL (ref 30.0–36.0)
MCV: 96.3 fL (ref 80.0–100.0)
MCV: 96.4 fL (ref 80.0–100.0)
Platelets: 181 10*3/uL (ref 150–400)
Platelets: 196 10*3/uL (ref 150–400)
RBC: 3.51 MIL/uL — ABNORMAL LOW (ref 3.87–5.11)
RBC: 3.58 MIL/uL — ABNORMAL LOW (ref 3.87–5.11)
RDW: 14.5 % (ref 11.5–15.5)
RDW: 14.1 % (ref 11.5–15.5)
WBC: 4.5 10*3/uL (ref 4.0–10.5)
WBC: 4.1 10*3/uL (ref 4.0–10.5)
nRBC: 0 % (ref 0.0–0.2)
nRBC: 0 % (ref 0.0–0.2)

## 2021-06-07 LAB — RESP PANEL BY RT-PCR (FLU A&B, COVID) ARPGX2
Influenza A by PCR: NEGATIVE
Influenza B by PCR: NEGATIVE
SARS Coronavirus 2 by RT PCR: NEGATIVE

## 2021-06-07 LAB — RUPTURE OF MEMBRANE (ROM)PLUS: Rom Plus: POSITIVE

## 2021-06-07 LAB — COMPREHENSIVE METABOLIC PANEL
ALT: 37 U/L (ref 0–44)
AST: 24 U/L (ref 15–41)
Albumin: 2.7 g/dL — ABNORMAL LOW (ref 3.5–5.0)
Alkaline Phosphatase: 164 U/L — ABNORMAL HIGH (ref 38–126)
Anion gap: 10 (ref 5–15)
BUN: 8 mg/dL (ref 6–20)
CO2: 23 mmol/L (ref 22–32)
Calcium: 9.1 mg/dL (ref 8.9–10.3)
Chloride: 105 mmol/L (ref 98–111)
Creatinine, Ser: 0.74 mg/dL (ref 0.44–1.00)
GFR, Estimated: >60 mL/min (ref >60)
Glucose, Bld: 83 mg/dL (ref 70–99)
Potassium: 3.8 mmol/L (ref 3.5–5.1)
Sodium: 138 mmol/L (ref 135–145)
Total Bilirubin: 1.3 mg/dL — ABNORMAL HIGH (ref 0.3–1.2)
Total Protein: 6.5 g/dL (ref 6.5–8.1)

## 2021-06-07 LAB — RPR: RPR Ser Ql: NONREACTIVE

## 2021-06-07 LAB — PROTEIN / CREATININE RATIO, URINE
Creatinine, Urine: 92 mg/dL
Protein Creatinine Ratio: 0.37 mg/mg{Cre} — ABNORMAL HIGH (ref 0.00–0.15)
Total Protein, Urine: 34 mg/dL

## 2021-06-07 MED ORDER — ACETAMINOPHEN 500 MG PO TABS: 1000.0000 mg | ORAL_TABLET | Freq: Four times a day (QID) | ORAL | Status: DC | PRN

## 2021-06-07 MED ORDER — SOD CITRATE-CITRIC ACID 500-334 MG/5ML PO SOLN: 30.0000 mL | ORAL | Status: DC | PRN

## 2021-06-07 MED ORDER — MAGNESIUM SULFATE BOLUS VIA INFUSION
4.0000 g | Freq: Once | INTRAVENOUS | Status: AC
  Administered 2021-06-07: 4 g via INTRAVENOUS
  Filled 2021-06-07: qty 1000

## 2021-06-07 MED ORDER — AMMONIA AROMATIC IN INHA
RESPIRATORY_TRACT | Status: AC
  Filled 2021-06-07: qty 10

## 2021-06-07 MED ORDER — ONDANSETRON HCL 4 MG/2ML IJ SOLN: 4.0000 mg | INTRAMUSCULAR | Status: DC | PRN

## 2021-06-07 MED ORDER — COCONUT OIL OIL
1.0000 "application " | TOPICAL_OIL | Status: DC | PRN
  Filled 2021-06-07: qty 120

## 2021-06-07 MED ORDER — CALCIUM CARBONATE ANTACID 500 MG PO CHEW: 2.0000 | CHEWABLE_TABLET | ORAL | Status: DC | PRN

## 2021-06-07 MED ORDER — PRENATAL MULTIVITAMIN CH
1.0000 | ORAL_TABLET | Freq: Every day | ORAL | Status: DC
  Administered 2021-06-07 – 2021-06-09 (×3): 1 via ORAL
  Filled 2021-06-07 (×3): qty 1

## 2021-06-07 MED ORDER — HYDRALAZINE HCL 20 MG/ML IJ SOLN: 10.0000 mg | INTRAMUSCULAR | Status: DC | PRN

## 2021-06-07 MED ORDER — DOCUSATE SODIUM 100 MG PO CAPS
100.0000 mg | ORAL_CAPSULE | Freq: Two times a day (BID) | ORAL | Status: DC
  Administered 2021-06-08 (×2): 100 mg via ORAL
  Filled 2021-06-07 (×3): qty 1

## 2021-06-07 MED ORDER — MAGNESIUM SULFATE 40 GM/1000ML IV SOLN
2.0000 g/h | INTRAVENOUS | Status: DC
  Administered 2021-06-07: 2 g/h via INTRAVENOUS
  Filled 2021-06-07: qty 1000

## 2021-06-07 MED ORDER — LACTATED RINGERS IV SOLN: 500.0000 mL | INTRAVENOUS | Status: DC | PRN

## 2021-06-07 MED ORDER — LACTATED RINGERS IV SOLN: INTRAVENOUS | Status: DC

## 2021-06-07 MED ORDER — LABETALOL HCL 5 MG/ML IV SOLN: 40.0000 mg | INTRAVENOUS | Status: DC | PRN

## 2021-06-07 MED ORDER — OXYTOCIN 10 UNIT/ML IJ SOLN
INTRAMUSCULAR | Status: AC
  Administered 2021-06-07: 10 [IU]
  Filled 2021-06-07: qty 2

## 2021-06-07 MED ORDER — DIPHENHYDRAMINE HCL 25 MG PO CAPS: 25.0000 mg | ORAL_CAPSULE | Freq: Four times a day (QID) | ORAL | Status: DC | PRN

## 2021-06-07 MED ORDER — SIMETHICONE 80 MG PO CHEW: 80.0000 mg | CHEWABLE_TABLET | ORAL | Status: DC | PRN

## 2021-06-07 MED ORDER — LABETALOL HCL 5 MG/ML IV SOLN
20.0000 mg | INTRAVENOUS | Status: DC | PRN
  Administered 2021-06-07: 20 mg via INTRAVENOUS
  Filled 2021-06-07 (×2): qty 4

## 2021-06-07 MED ORDER — IBUPROFEN 600 MG PO TABS
600.0000 mg | ORAL_TABLET | Freq: Four times a day (QID) | ORAL | Status: DC
  Administered 2021-06-08 – 2021-06-09 (×2): 600 mg via ORAL
  Filled 2021-06-07 (×2): qty 1

## 2021-06-07 MED ORDER — ONDANSETRON HCL 4 MG/2ML IJ SOLN: 4.0000 mg | Freq: Four times a day (QID) | INTRAMUSCULAR | Status: DC | PRN

## 2021-06-07 MED ORDER — OXYTOCIN 10 UNIT/ML IJ SOLN
10.0000 [IU] | Freq: Once | INTRAMUSCULAR | Status: AC
Start: 2021-06-07 — End: 2021-06-07

## 2021-06-07 MED ORDER — LIDOCAINE HCL (PF) 1 % IJ SOLN: 30.0000 mL | INTRAMUSCULAR | Status: DC | PRN

## 2021-06-07 MED ORDER — LIDOCAINE HCL (PF) 1 % IJ SOLN
INTRAMUSCULAR | Status: AC
  Filled 2021-06-07: qty 30

## 2021-06-07 MED ORDER — WITCH HAZEL-GLYCERIN EX PADS: 1.0000 "application " | MEDICATED_PAD | CUTANEOUS | Status: DC

## 2021-06-07 MED ORDER — BENZOCAINE-MENTHOL 20-0.5 % EX AERO: 1.0000 "application " | INHALATION_SPRAY | CUTANEOUS | Status: DC | PRN

## 2021-06-07 MED ORDER — LABETALOL HCL 5 MG/ML IV SOLN: 80.0000 mg | INTRAVENOUS | Status: DC | PRN

## 2021-06-07 MED ORDER — MISOPROSTOL 200 MCG PO TABS
ORAL_TABLET | ORAL | Status: AC
  Filled 2021-06-07: qty 4

## 2021-06-07 MED ORDER — OXYTOCIN-SODIUM CHLORIDE 30-0.9 UT/500ML-% IV SOLN
INTRAVENOUS | Status: AC
  Filled 2021-06-07: qty 500

## 2021-06-07 MED ORDER — DIBUCAINE (PERIANAL) 1 % EX OINT: 1.0000 "application " | TOPICAL_OINTMENT | CUTANEOUS | Status: DC | PRN

## 2021-06-07 MED ORDER — ONDANSETRON HCL 4 MG PO TABS: 4.0000 mg | ORAL_TABLET | ORAL | Status: DC | PRN

## 2021-06-07 MED ORDER — NIFEDIPINE ER OSMOTIC RELEASE 30 MG PO TB24
30.0000 mg | ORAL_TABLET | Freq: Every day | ORAL | Status: DC
  Administered 2021-06-07 – 2021-06-09 (×3): 30 mg via ORAL
  Filled 2021-06-07 (×3): qty 1

## 2021-06-07 NOTE — H&P (Addendum)
OB History & Physical   History of Present Illness:   Chief Complaint: leaking amniotic fluid   HPI:  Patricia Sheppard is a 41 y.o. [phone] female at 5619w4d dated by US at 6221w2d, not c/w LMP.  She presents to L&D for a large gush of clear fluid at home.  Denies any regular contractions prior to arrival but states that contractions became more frequent and intense since arrival to L&D. Also reports decreased fetal movement tonight.  Denies vaginal bleeding   Reports decreased fetal movement  Contractions: every 2 to 4 minutes, started at 0200 LOF/SROM: large gush of fluid at 0115 Vaginal bleeding: denies   Factors complicating pregnancy:  Late entry into prenatal care  Fetal growth restriction  History of IUFD  History of incompetent cervix with previable delivery x 3 History of multple antibodies - 2020 pos for c,e, Kidd A, M, S and s - prenatal screen was negative for current antibodies  Maternal iron deficiency anemia of pregnancy  AMA  Chlamydia in pregnancy - TOC negative  Sickle cell trait   Patient Active Problem List   Diagnosis Date Noted   History of cervical incompetence in pregnancy, currently pregnant 06/07/2021   Leakage of amniotic fluid 06/07/2021   Preterm labor 06/07/2021   Gestational hypertension 06/07/2021   Recurrent pregnancy loss 04/30/2021   Multigravida of advanced maternal age in third trimester 04/30/2021   Sickle cell trait (HCC) 04/08/2021   Supervision of other high risk pregnancies, third trimester 03/28/2021   History of IUFD    Abnormal antibody titer 12/09/2018     Maternal Medical History:  History reviewed. No pertinent past medical history.  Past Surgical History:  Procedure Laterality Date   NO PAST SURGERIES      Allergies  Allergen Reactions   Sulfa Antibiotics Rash    Prior to Admission medications   Medication Sig Start Date End Date Taking? Authorizing Provider  Prenatal Vit-Fe Fumarate-FA (PRENATAL 19) 29-1 MG CHEW One  daily 03/26/21  Yes Rodriguez-Southworth, Nettie ElmSylvia, PA-C     Prenatal care site:  [facility]   Social History: She  reports that she has been smoking cigars. She has never used smokeless tobacco. She reports that she does not currently use alcohol. She reports that she does not use drugs.  Family History: family history includes Hypertension in her maternal grandmother.   Review of Systems: A full review of systems was performed and negative except as noted in the HPI.     Physical Exam:  Vital Signs: BP (!) 161/84    Pulse 62    Temp 98.3 F (36.8 C) (Oral)    Resp 18    Ht 5\' 7"  (1.702 m)    Wt 89.8 kg    LMP 09/12/2020 (Approximate) Comment: Irregular periods   BMI 31.01 kg/m  Physical Exam  General: no acute distress.  HEENT: normocephalic, atraumatic Heart: regular rate & rhythm.  No murmurs/rubs/gallops Lungs: clear to auscultation bilaterally, normal respiratory effort Abdomen: soft, gravid, non-tender;  EFW: 5 lbs  Pelvic:   External: Normal external female genitalia  Cervix: Dilation: 10 / Effacement (%): 100 / Station: Crowning   Rapid change from 3-4->7-8-> 10 -> crowning    Extremities: non-tender, symmetric, No edema bilaterally.  DTRs: 2+/2+  Neurologic: Alert & oriented x 3.    Results for orders placed or performed during the hospital encounter of 06/07/21 (from the past 24 hour(s))  ROM Plus ([facility] only)     Status: None   Collection  Time: 06/07/21  2:23 AM  Result Value Ref Range   Rom Plus POSITIVE   Urinalysis, Complete w Microscopic Urine, Clean Catch     Status: Abnormal   Collection Time: 06/07/21  2:43 AM  Result Value Ref Range   Color, Urine YELLOW YELLOW   APPearance CLOUDY (A) CLEAR   Specific Gravity, Urine 1.015 1.005 - 1.030   pH 6.5 5.0 - 8.0   Glucose, UA NEGATIVE NEGATIVE mg/dL   Hgb urine dipstick SMALL (A) NEGATIVE   Bilirubin Urine NEGATIVE NEGATIVE   Ketones, ur TRACE (A) NEGATIVE mg/dL   Protein, ur 30 (A) NEGATIVE mg/dL    Nitrite NEGATIVE NEGATIVE   Leukocytes,Ua MODERATE (A) NEGATIVE   Squamous Epithelial / LPF 6-10 0 - 5   WBC, UA 21-50 0 - 5 WBC/hpf   RBC / HPF 6-10 0 - 5 RBC/hpf   Bacteria, UA MANY (A) NONE SEEN   Mucus PRESENT   Protein / creatinine ratio, urine     Status: Abnormal   Collection Time: 06/07/21  2:43 AM  Result Value Ref Range   Creatinine, Urine 92 mg/dL   Total Protein, Urine 34 mg/dL   Protein Creatinine Ratio 0.37 (H) 0.00 - 0.15 mg/mg[Cre]  CBC     Status: Abnormal   Collection Time: 06/07/21  2:43 AM  Result Value Ref Range   WBC 4.5 4.0 - 10.5 K/uL   RBC 3.51 (L) 3.87 - 5.11 MIL/uL   Hemoglobin 11.2 (L) 12.0 - 15.0 g/dL   HCT 78.5 (L) 88.5 - 02.7 %   MCV 96.3 80.0 - 100.0 fL   MCH 31.9 26.0 - 34.0 pg   MCHC 33.1 30.0 - 36.0 g/dL   RDW 74.1 28.7 - 86.7 %   Platelets 181 150 - 400 K/uL   nRBC 0.0 0.0 - 0.2 %  Type and screen [facility]     Status: None (Preliminary result)   Collection Time: 06/07/21  2:43 AM  Result Value Ref Range   ABO/RH(D) PENDING    Antibody Screen PENDING    Sample Expiration      06/10/2021,2359 Performed at [facility], [address] [place], [place] [address]     Pertinent Results:  Prenatal Labs: Blood type/Rh A pos  Antibody screen neg  Rubella Immune  Varicella Immune  RPR NR  HBsAg Neg  HIV NR  GC neg  Chlamydia neg  Genetic screening Declined   1 hour GTT 107  3 hour GTT N/A   GBS Neg   FHT:  FHR: 115 bpm, variability: moderate,  accelerations:  Present,  decelerations:  Present occasional earlies and variables  Category/reactivity:  Category I UC:   regular, every 2-4 minutes   Cephalic by Leopolds and SVE   No results found.  Assessment:  Patricia Sheppard is a 41 y.o. [phone] female at [redacted]w[redacted]d with SROM and preterm labor, rapidly changing cervix.   Plan:  1. Admit to Labor & Delivery; consents reviewed and obtained - Covid admission screen  - Delivery room prepared quickly for  immanent delivery   2. Fetal Well being  - Fetal Tracing: cat 1 - Group B Streptococcus ppx not indicated: GBS neg - Presentation: cephalic confirmed by SVE   3. Routine OB: - Prenatal labs reviewed, as above - Rh pos - CBC, T&S, RPR on admit - Clear fluids, saline lock  4. Monitoring of labor  - Contractions monitored with external toco - Pelvis adequate for trial of labor  -  Plan for expectant management  - Plan for  continuous fetal monitoring - Maternal pain control as desired; desires epidural but labor progressing rapidly  - Anticipate vaginal delivery  5. Post Partum Planning: - Infant feeding: formula - Contraception: TBD - Tdap vaccine: given 04/10/2021 - Flu vaccine: declined   Gustavo Lah, CNM 06/07/21 3:51 AM  Margaretmary Eddy, CNM Certified Nurse Midwife [facility]

## 2021-06-07 NOTE — Progress Notes (Signed)
S: resting in bed, denies HA, visual changes or RUQ pain   O: BP 131/87    Pulse 77    Temp (!) 97.3 F (36.3 C) (Axillary)    Resp 14    Ht 5\' 7"  (1.702 m)    Wt 89.8 kg    LMP 09/12/2020 (Approximate) Comment: Irregular periods   SpO2 97%    Breastfeeding Unknown    BMI 31.01 kg/m  Vitals:   06/07/21 0420 06/07/21 0434 06/07/21 0455 06/07/21 0505  BP: (!) 167/102 (!) 173/102 (!) 181/98 (!) 145/89   06/07/21 0520 06/07/21 0534 06/07/21 0605 06/07/21 0635  BP: 132/84 (!) 142/87 (!) 143/88 131/77   06/07/21 0735 06/07/21 0834 06/07/21 0935 06/07/21 1034  BP: 139/64 (!) 146/92 (!) 142/95 131/87      Physical Exam:  General: alert, cooperative, and no distress CV: RRR Pulm: nl effort Abdomen: soft, non-tender Uterine Fundus: firm Perineum: minimal edema, laceration hemostatic Lochia: scant Lower extremities: no edema  Recent Labs    06/07/21 0243 06/07/21 0452  HGB 11.2* 11.6*  HCT 33.8* 34.5*  WBC 4.5 4.1  PLT 181 196     Assessment/Plan: 41 y.o. 24 with preeclampsia with severe features, PPD 0  Preeclampsia with severe features: -BPs noted above -Urine output 1074mL/3 hrs -PCR 370, platelets normal, AST/ALT/Cr WNL  -Hypertensive protocol ordered to treat b/ps 160/110 or greater with IV antihypertensives  -Magnesium sulfate infusion for seizure prophylaxis for 24 hours postpartum  -SCDs ordered  -Bedrest with bedside commode privileges -Procardia 30mg  Xl started this am     80m, CNM Certified Nurse Midwife [facility]

## 2021-06-07 NOTE — Progress Notes (Signed)
S: resting in bed, skin to skin with infant, denies HA, visual changes or RUQ pain   O: BP (!) 167/102    Pulse (!) 55    Temp 98.3 F (36.8 C) (Oral)    Resp 18    Ht 5\' 7"  (1.702 m)    Wt 89.8 kg    LMP 09/12/2020 (Approximate) Comment: Irregular periods   Breastfeeding Unknown    BMI 31.01 kg/m  Vitals:   06/07/21 0214 06/07/21 0218 06/07/21 0233 06/07/21 0249  BP: (!) 148/104 (!) 154/91 (!) 151/93 (!) 148/96   06/07/21 0331 06/07/21 0346 06/07/21 0401 06/07/21 0420  BP: (!) 158/73 (!) 161/84 (!) 157/97 (!) 167/102      Physical Exam:  General: alert, cooperative, and no distress CV: RRR Pulm: nl effort, CTABL Abdomen: soft, non-tender, active bowel sounds Uterine Fundus: firm Perineum: minimal edema, laceration hemostatic Lochia: scant Lower extremities: no edema, reflexes 2+/2+  Recent Labs    06/07/21 0243  HGB 11.2*  HCT 33.8*  WBC 4.5  PLT 181    Assessment/Plan: 41 y.o. [id number] with preeclampsia with severe features   Preeclampsia with severe features: -Several severe range b/p's noted  -Urine PCR 370, platelets normal, CMP pending  -Meets for criteria for preeclampsia with severe features by BP's -Hypertensive protocol ordered to treat b/ps 160/110 or greater with IV antihypertensives  -Will start Magnesium sulfate infusion for seizure prophylaxis for 24 hours postpartum  -SCDs ordered  -Bedrest with bedside commode privileges -Will start Procardia 30mg  Xl this am     ----- Drinda Butts  Certified Nurse Midwife [facility]

## 2021-06-07 NOTE — Discharge Summary (Signed)
Obstetrical Discharge Summary  Patient Name: Patricia Sheppard DOB: 11/30/1980 MRN: [id number]  Date of Admission: 06/07/2021 Date of Delivery: 06/07/2021 Delivered by: Margaretmary Eddy, CNM  Date of Discharge: 06/09/2021  Primary OB:  [facility]  CHY:[id number] last menstrual period was 09/12/2020 (approximate). EDC Estimated Date of Delivery: 07/01/21 Gestational Age at Delivery: [redacted]w[redacted]d   Antepartum complications:  Preeclampsia  Late entry into prenatal care  Fetal growth restriction  History of IUFD  History of incompetent cervix with previable delivery x 3 History of multple antibodies - 2020 pos for c,e, Kidd A, M, S and s - prenatal screen was negative for current antibodies  Maternal iron deficiency anemia of pregnancy  AMA  Chlamydia in pregnancy - TOC negative  Sickle cell trait   Admitting Diagnosis: Leakage of amniotic fluid [O42.90] Preterm labor [O60.00]  Secondary Diagnosis: Patient Active Problem List   Diagnosis Date Noted   History of cervical incompetence in pregnancy, currently pregnant 06/07/2021   Leakage of amniotic fluid 06/07/2021   Preterm labor 06/07/2021   Preeclampsia 06/07/2021   Recurrent pregnancy loss 04/30/2021   Multigravida of advanced maternal age in third trimester 04/30/2021   Sickle cell trait (HCC) 04/08/2021   Supervision of other high risk pregnancies, third trimester 03/28/2021   History of IUFD    Abnormal antibody titer 12/09/2018    Augmentation: N/A Complications: precipitous labor  Intrapartum complications/course: Patricia Sheppard presented to L&D with complaints of leakage of amniotic fluid.  She started contracting while in triage and they quickly became more frequent and intense.  She progressed rapidly from 3-4cm to 7-8/100/+2.  Patricia Sheppard was moved from triage to a labor room via [facility] with CNM.  She reported an urge to push as she arrived in the labor room and infant was crowning.  She pushed one time for a quick spontaneous vaginal  birth.  Delivery Type: spontaneous vaginal delivery Anesthesia: None Placenta: spontaneous Laceration: left labial abrasion - hemostatic, approximates well, no repair  Episiotomy: none Newborn Data: Live born female "Brandyn" Birth Weight: 5lbs 11oz, 2750g  APGAR: 8, 9  Newborn Delivery   Birth date/time: 06/07/2021 03:02:00 Delivery type: Vaginal, Spontaneous     Postpartum Procedures: PP Magnesium for Preeclampsia Edinburgh:  Edinburgh Postnatal Depression Scale Screening Tool 06/08/2021  I have been able to laugh and see the funny side of things. 1  I have looked forward with enjoyment to things. 0  I have blamed myself unnecessarily when things went wrong. 3  I have been anxious or worried for no good reason. 2  I have felt scared or panicky for no good reason. 3  Things have been getting on top of me. 2  I have been so unhappy that I have had difficulty sleeping. 0  I have felt sad or miserable. 0  I have been so unhappy that I have been crying. 0  The thought of harming myself has occurred to me. 0  Edinburgh Postnatal Depression Scale Total 11     Post partum course:  Patient had an complicated postpartum course by preeclampsia with severe features. Was on Magnesium for 16 hours and started on Procardia Xl 30 mg daily.  By time of discharge on PPD#2, her pain was controlled on oral pain medications; she had appropriate lochia and was ambulating, voiding without difficulty and tolerating regular diet.  She was deemed stable for discharge to home.    Discharge Physical Exam:  BP 116/80 (BP Location: Right Arm)    Pulse 89    Temp  99.7 F (37.6 C) (Oral)    Resp 18    Ht 5\' 7"  (1.702 m)    Wt 89.8 kg    LMP 09/12/2020 (Approximate) Comment: Irregular periods   SpO2 98%    Breastfeeding Unknown    BMI 31.01 kg/m   General: NAD CV: RRR Pulm: CTABL, nl effort ABD: s/nd/nt, fundus firm and below the umbilicus Lochia: moderate Perineum:minimal edema/intact DVT Evaluation: LE  non-ttp, no evidence of DVT on exam.  Hemoglobin  Date Value Ref Range Status  06/07/2021 11.6 (L) 12.0 - 15.0 g/dL Final   HCT  Date Value Ref Range Status  06/07/2021 34.5 (L) 36.0 - 46.0 % Final     Disposition: stable, discharge to home. Baby Feeding: formula Baby Disposition: home with mom  Rh Immune globulin given: Rh pos Rubella vaccine given: Immune Varivax vaccine given: Immune Flu vaccine given in AP or PP setting: declined  Tdap vaccine given in AP or PP setting: given 04/10/2021  Contraception: Declined  Prenatal Labs:  Blood type/Rh A pos  Antibody screen neg  Rubella Immune  Varicella Immune  RPR NR  HBsAg Neg  HIV NR  GC neg  Chlamydia neg  Genetic screening Declined   1 hour GTT 107  3 hour GTT N/A   GBS Neg    Plan:  Patricia Sheppard was discharged to home in good condition. Follow-up appointment with delivering provider in 6 weeks.  Discharge Medications: Allergies as of 06/09/2021       Reactions   Sulfa Antibiotics Rash        Medication List     TAKE these medications    acetaminophen 500 MG tablet Commonly known as: TYLENOL Take 2 tablets (1,000 mg total) by mouth every 6 (six) hours as needed (for pain scale < 4).   benzocaine-Menthol 20-0.5 % Aero Commonly known as: DERMOPLAST Apply 1 application topically as needed for irritation (perineal discomfort).   coconut oil Oil Apply 1 application topically as needed.   dibucaine 1 % Oint Commonly known as: NUPERCAINAL Place 1 application rectally as needed for hemorrhoids.   docusate sodium 100 MG capsule Commonly known as: COLACE Take 1 capsule (100 mg total) by mouth 2 (two) times daily.   ibuprofen 600 MG tablet Commonly known as: ADVIL Take 1 tablet (600 mg total) by mouth every 6 (six) hours.   NIFEdipine 30 MG 24 hr tablet Commonly known as: ADALAT CC Take 1 tablet (30 mg total) by mouth daily.   Prenatal 19 29-1 MG Chew One daily   simethicone 80 MG chewable  tablet Commonly known as: MYLICON Chew 1 tablet (80 mg total) by mouth as needed for flatulence.   witch hazel-glycerin pad Commonly known as: TUCKS Apply 1 application topically continuous.         Follow-up Information     06/11/2021, CNM. Schedule an appointment as soon as possible for a visit in 6 week(s).   Specialty: Certified Nurse Midwife Why: postpartum visit Contact information: [address] [place] [phone]         [facility] Follow up in 3 day(s).   Why: BP check Contact information: [address] [place] [phone]                Signed: [phone] CNM

## 2021-06-07 NOTE — Discharge Instructions (Signed)
Vaginal Delivery, Care After Refer to this sheet in the next few weeks. These discharge instructions provide you with information on caring for yourself after delivery. Your caregiver may also give you specific instructions. Your treatment has been planned according to the most current medical practices available, but problems sometimes occur. Call your caregiver if you have any problems or questions after you go home. HOME CARE INSTRUCTIONS Take over-the-counter or prescription medicines only as directed by your caregiver or pharmacist. Do not drink alcohol, especially if you are breastfeeding or taking medicine to relieve pain. Do not smoke tobacco. Continue to use good perineal care. Good perineal care includes: Wiping your perineum from back to front Keeping your perineum clean. You can do sitz baths twice a day, to help keep this area clean Do not use tampons, douche or have sex until your caregiver says it is okay. Shower only and avoid sitting in submerged water, aside from sitz baths Wear a well-fitting bra that provides breast support. Eat healthy foods. Drink enough fluids to keep your urine clear or pale yellow. Eat high-fiber foods such as whole grain cereals and breads, brown rice, beans, and fresh fruits and vegetables every day. These foods may help prevent or relieve constipation. Avoid constipation with high fiber foods or medications, such as miralax or metamucil Follow your caregiver's recommendations regarding resumption of activities such as climbing stairs, driving, lifting, exercising, or traveling. Talk to your caregiver about resuming sexual activities. Resumption of sexual activities is dependent upon your risk of infection, your rate of healing, and your comfort and desire to resume sexual activity. Try to have someone help you with your household activities and your newborn for at least a few days after you leave the hospital. Rest as much as possible. Try to rest or  take a nap when your newborn is sleeping. Increase your activities gradually. Keep all of your scheduled postpartum appointments. It is very important to keep your scheduled follow-up appointments. At these appointments, your caregiver will be checking to make sure that you are healing physically and emotionally. SEEK MEDICAL CARE IF:  You are passing large clots from your vagina. Save any clots to show your caregiver. You have a foul smelling discharge from your vagina. You have trouble urinating. You are urinating frequently. You have pain when you urinate. You have a change in your bowel movements. You have increasing redness, pain, or swelling near your vaginal incision (episiotomy) or vaginal tear. You have pus draining from your episiotomy or vaginal tear. Your episiotomy or vaginal tear is separating. You have painful, hard, or reddened breasts. You have a severe headache. You have blurred vision or see spots. You feel sad or depressed. You have thoughts of hurting yourself or your newborn. You have questions about your care, the care of your newborn, or medicines. You are dizzy or light-headed. You have a rash. You have nausea or vomiting. You were breastfeeding and have not had a menstrual period within 12 weeks after you stopped breastfeeding. You are not breastfeeding and have not had a menstrual period by the 12th week after delivery. You have a fever. SEEK IMMEDIATE MEDICAL CARE IF:  You have persistent pain. You have chest pain. You have shortness of breath. You faint. You have leg pain. You have stomach pain. Your vaginal bleeding saturates two or more sanitary pads in 1 hour. MAKE SURE YOU:  Understand these instructions. Will watch your condition. Will get help right away if you are not doing well or   get worse. Document Released: 04/24/2000 Document Revised: 09/11/2013 Document Reviewed: 12/23/2011 ExitCare Patient Information 2015 [employer]. This  information is not intended to replace advice given to you by your health care provider. Make sure you discuss any questions you have with your health care provider.  Sitz Bath A sitz bath is a warm water bath taken in the sitting position. The water covers only the hips and butt (buttocks). We recommend using one that fits in the toilet, to help with ease of use and cleanliness. It may be used for either healing or cleaning purposes. Sitz baths are also used to relieve pain, itching, or muscle tightening (spasms). The water may contain medicine. Moist heat will help you heal and relax.  HOME CARE  Take 3 to 4 sitz baths a day. Fill the bathtub half-full with warm water. Sit in the water and open the drain a little. Turn on the warm water to keep the tub half-full. Keep the water running constantly. Soak in the water for 15 to 20 minutes. After the sitz bath, pat the affected area dry. GET HELP RIGHT AWAY IF: You get worse instead of better. Stop the sitz baths if you get worse. MAKE SURE YOU: Understand these instructions. Will watch your condition. Will get help right away if you are not doing well or get worse. Document Released: 06/04/2004 Document Revised: 01/20/2012 Document Reviewed: 08/25/2010 ExitCare Patient Information 2015 [employer]. This information is not intended to replace advice given to you by your health care provider. Make sure you discuss any questions you have with your health care provider.   

## 2021-06-08 NOTE — Progress Notes (Signed)
Post Partum Day 1  Subjective: Doing well, no concerns. Ambulating without difficulty, pain managed with PO meds, tolerating regular diet, and voiding without difficulty.   No fever/chills, chest pain, shortness of breath, nausea/vomiting, or leg pain. No nipple or breast pain. No headache, visual changes, or RUQ/epigastric pain.  Objective: BP 133/90 (BP Location: Right Arm)    Pulse 90    Temp 100.1 F (37.8 C) (Oral) Comment: nurse Kendra notified   Resp 16    Ht 5\' 7"  (1.702 m)    Wt 89.8 kg    LMP 09/12/2020 (Approximate) Comment: Irregular periods   SpO2 96%    Breastfeeding Unknown    BMI 31.01 kg/m    Physical Exam:  General: alert and cooperative Breasts: soft/nontender CV: RRR Pulm: nl effort Abdomen: soft, non-tender Uterine Fundus: firm Incision: n/a Perineum: minimal edema, intact Lochia: appropriate DVT Evaluation: No evidence of DVT seen on physical exam. Edinburgh:  Edinburgh Postnatal Depression Scale Screening Tool 06/08/2021  I have been able to laugh and see the funny side of things. 1  I have looked forward with enjoyment to things. 0  I have blamed myself unnecessarily when things went wrong. 3  I have been anxious or worried for no good reason. 2  I have felt scared or panicky for no good reason. 3  Things have been getting on top of me. 2  I have been so unhappy that I have had difficulty sleeping. 0  I have felt sad or miserable. 0  I have been so unhappy that I have been crying. 0  The thought of harming myself has occurred to me. 0  Edinburgh Postnatal Depression Scale Total 11     Recent Labs    06/07/21 0243 06/07/21 0452  HGB 11.2* 11.6*  HCT 33.8* 34.5*  WBC 4.5 4.1  PLT 181 196    Assessment/Plan: 41 y.o. 06/09/21 postpartum day # 1  -Blood pressure -- Mag Sulfate off at 0500 -- Output 5000/12 hrs -- Procardia 30mg  QD given at 1000 Vitals:   06/08/21 0515 06/08/21 0531 06/08/21 0546 06/08/21 0601  BP: 127/83 128/83 132/83 (!)  155/91   06/08/21 0616 06/08/21 0630 06/08/21 0645 06/08/21 0701  BP: (!) 142/86 126/77 130/78 138/77   06/08/21 0715 06/08/21 0731 06/08/21 0746 06/08/21 0845  BP: 126/78 125/75 127/84 133/90    -Continue routine postpartum care -Bottle feeding -Immunization status: Needs all immunizations up to date  Disposition: Continue inpatient postpartum care    LOS: 1 day   Keng Jewel, CNM 06/08/2021, 10:50 AM

## 2021-06-09 MED ORDER — BENZOCAINE-MENTHOL 20-0.5 % EX AERO: 1.0000 "application " | INHALATION_SPRAY | CUTANEOUS | Status: AC | PRN

## 2021-06-09 MED ORDER — ACETAMINOPHEN 500 MG PO TABS: 1000.0000 mg | ORAL_TABLET | Freq: Four times a day (QID) | ORAL | 0 refills | Status: AC | PRN

## 2021-06-09 MED ORDER — NIFEDIPINE ER 30 MG PO TB24: 30.0000 mg | ORAL_TABLET | Freq: Every day | ORAL | 0 refills | Status: AC

## 2021-06-09 MED ORDER — SIMETHICONE 80 MG PO CHEW
80.0000 mg | CHEWABLE_TABLET | ORAL | 0 refills | Status: AC | PRN
Start: 2021-06-09 — End: ?

## 2021-06-09 MED ORDER — COCONUT OIL OIL
1.0000 | TOPICAL_OIL | 0 refills | Status: AC | PRN
Start: 2021-06-09 — End: ?

## 2021-06-09 MED ORDER — DIBUCAINE (PERIANAL) 1 % EX OINT: 1.0000 "application " | TOPICAL_OINTMENT | CUTANEOUS | Status: AC | PRN

## 2021-06-09 MED ORDER — WITCH HAZEL-GLYCERIN EX PADS
1.0000 | MEDICATED_PAD | CUTANEOUS | 12 refills | Status: AC
Start: 2021-06-09 — End: ?

## 2021-06-09 MED ORDER — IBUPROFEN 600 MG PO TABS
600.0000 mg | ORAL_TABLET | Freq: Four times a day (QID) | ORAL | 0 refills | Status: AC
Start: 2021-06-09 — End: ?

## 2021-06-09 MED ORDER — DOCUSATE SODIUM 100 MG PO CAPS
100.0000 mg | ORAL_CAPSULE | Freq: Two times a day (BID) | ORAL | 0 refills | Status: AC
Start: 2021-06-09 — End: ?

## 2021-06-09 NOTE — Discharge Summary (Signed)
Postpartum Discharge Summary  Date of Service updated 06/09/2021     Patient Name: Patricia Sheppard DOB: February 06, 1981 MRN: [id number]  Date of admission: 06/07/2021 Delivery date:06/07/2021  Delivering provider: Minda Meo  Date of discharge: 06/09/2021  Admitting diagnosis: Leakage of amniotic fluid [O42.90] Preterm labor [O60.00] Intrauterine pregnancy: [redacted]w[redacted]d     Secondary diagnosis:  Principal Problem:   Leakage of amniotic fluid Active Problems:   Preterm labor   Preeclampsia  Additional problems:     Discharge diagnosis: Preterm Pregnancy Delivered and Preeclampsia (severe)                                              Post partum procedures: IV Magnesium Augmentation: N/A Complications: [facility] course: Onset of Labor With Vaginal Delivery      41 y.o. yo [id number] at [redacted]w[redacted]d was admitted in Active Labor on 06/07/2021. Patient had an uncomplicated labor course as follows:  Membrane Rupture Time/Date: 1:15 AM ,06/07/2021   Delivery Method:Vaginal, Spontaneous  Episiotomy: None  Lacerations:  None  Patient had an uncomplicated postpartum course.  She is ambulating, tolerating a regular diet, passing flatus, and urinating well. Patient is discharged home in stable condition on 06/09/21.  Newborn Data: Birth date:06/07/2021  Birth time:3:02 AM  Gender:Female  Living status:Living  Apgars:8 ,9  Weight:2570 g   Magnesium Sulfate received: Yes: Seizure prophylaxis BMZ received: No Rhophylac:N/A MMR:N/A T-DaP:Given prenatally Flu: N/A Transfusion:No  Physical exam  Vitals:   06/08/21 1930 06/08/21 2316 06/09/21 0430 06/09/21 0755  BP: 132/81 137/77 122/62 116/80  Pulse: 85 95 (!) 108 89  Resp: $Remo'17 18 17 18  'oFlwM$ Temp: 99.9 F (37.7 C) 99.5 F (37.5 C) 98.7 F (37.1 C) 99.7 F (37.6 C)  TempSrc: Oral Oral Oral Oral  SpO2: 98% 97% 96% 98%  Weight:      Height:       General: alert, cooperative, and no distress Lochia: appropriate Uterine  Fundus: firm DVT Evaluation: No evidence of DVT seen on physical exam. Negative Homan's sign. No cords or calf tenderness. No significant calf/ankle edema. Labs: Lab Results  Component Value Date   WBC 4.1 06/07/2021   HGB 11.6 (L) 06/07/2021   HCT 34.5 (L) 06/07/2021   MCV 96.4 06/07/2021   PLT 196 06/07/2021   CMP Latest Ref Rng & Units 06/07/2021  Glucose 70 - 99 mg/dL 83  BUN 6 - 20 mg/dL 8  Creatinine 0.44 - 1.00 mg/dL 0.74  Sodium 135 - 145 mmol/L 138  Potassium 3.5 - 5.1 mmol/L 3.8  Chloride 98 - 111 mmol/L 105  CO2 22 - 32 mmol/L 23  Calcium 8.9 - 10.3 mg/dL 9.1  Total Protein 6.5 - 8.1 g/dL 6.5  Total Bilirubin 0.3 - 1.2 mg/dL 1.3(H)  Alkaline Phos 38 - 126 U/L 164(H)  AST 15 - 41 U/L 24  ALT 0 - 44 U/L 37   Edinburgh Score: Edinburgh Postnatal Depression Scale Screening Tool 06/08/2021  I have been able to laugh and see the funny side of things. 1  I have looked forward with enjoyment to things. 0  I have blamed myself unnecessarily when things went wrong. 3  I have been anxious or worried for no good reason. 2  I have felt scared or panicky for no good reason. 3  Things have been getting on top of me.  2  I have been so unhappy that I have had difficulty sleeping. 0  I have felt sad or miserable. 0  I have been so unhappy that I have been crying. 0  The thought of harming myself has occurred to me. 0  Edinburgh Postnatal Depression Scale Total 11      After visit meds:  Allergies as of 06/09/2021       Reactions   Sulfa Antibiotics Rash        Medication List     TAKE these medications    acetaminophen 500 MG tablet Commonly known as: TYLENOL Take 2 tablets (1,000 mg total) by mouth every 6 (six) hours as needed (for pain scale < 4).   benzocaine-Menthol 20-0.5 % Aero Commonly known as: DERMOPLAST Apply 1 application topically as needed for irritation (perineal discomfort).   coconut oil Oil Apply 1 application topically as needed.    dibucaine 1 % Oint Commonly known as: NUPERCAINAL Place 1 application rectally as needed for hemorrhoids.   docusate sodium 100 MG capsule Commonly known as: COLACE Take 1 capsule (100 mg total) by mouth 2 (two) times daily.   ibuprofen 600 MG tablet Commonly known as: ADVIL Take 1 tablet (600 mg total) by mouth every 6 (six) hours.   NIFEdipine 30 MG 24 hr tablet Commonly known as: ADALAT CC Take 1 tablet (30 mg total) by mouth daily.   Prenatal 19 29-1 MG Chew One daily   simethicone 80 MG chewable tablet Commonly known as: MYLICON Chew 1 tablet (80 mg total) by mouth as needed for flatulence.   witch hazel-glycerin pad Commonly known as: TUCKS Apply 1 application topically continuous.         Discharge home in stable condition Infant Feeding: Bottle Infant Disposition:home with mother Discharge instruction: per After Visit Summary and Postpartum booklet. Activity: Advance as tolerated. Pelvic rest for 6 weeks.  Diet: routine diet Anticipated Birth Control: Unsure Postpartum Appointment:6 weeks Additional Postpartum F/U: BP check 2-3 days Future Appointments:No future appointments. Follow up Visit:  Follow-up Information     Minda Meo, CNM. Schedule an appointment as soon as possible for a visit in 6 week(s).   Specialty: Certified Nurse Midwife Why: postpartum visit Contact information: [place] [address] [phone]         [facility] Follow up in 3 day(s).   Why: BP check Contact information: [place] [phone]                    8/91/6945 Virginia Lenoard Aden, CNM

## 2021-06-09 NOTE — TOC Initial Note (Signed)
Transition of Care [facility]) - Initial/Assessment Note    Patient Details  Name: Patricia Sheppard MRN: [id number] Date of Birth: May 14, 1980  [facility]) CM/SW Contact:    Dixmoor Cellar, RN Phone Number: 06/09/2021, 12:47 PM  Clinical Narrative:                 Spoke with patient who reported no need from [facility] and would prefer not to discuss further.  Reports this is her first child and she has plenty of support from family and friends. Reports she has assistance as needed with transportation and has all needed equipment. Reports she will be bottlefeeding and knows how to engage with [facility] if needed. Unwilling to discuss [place] or PPD reporting she is fine and able to discuss any concerns with MD.         Patient Goals and CMS Choice        Expected Discharge Plan and Services           Expected Discharge Date: 06/09/21                                    Prior Living Arrangements/Services                       Activities of Daily Living Home Assistive Devices/Equipment: None ADL Screening (condition at time of admission) Patient's cognitive ability adequate to safely complete daily activities?: Yes Is the patient deaf or have difficulty hearing?: No Does the patient have difficulty seeing, even when wearing glasses/contacts?: No Does the patient have difficulty concentrating, remembering, or making decisions?: No Patient able to express need for assistance with ADLs?: Yes Does the patient have difficulty dressing or bathing?: No Independently performs ADLs?: Yes (appropriate for developmental age) Does the patient have difficulty walking or climbing stairs?: No Weakness of Legs: None Weakness of Arms/Hands: None  Permission Sought/Granted                  Emotional Assessment              Admission diagnosis:  Leakage of amniotic fluid [O42.90] Preterm labor [O60.00] Patient Active Problem List   Diagnosis Date Noted    History of cervical incompetence in pregnancy, currently pregnant 06/07/2021   Leakage of amniotic fluid 06/07/2021   Preterm labor 06/07/2021   Preeclampsia 06/07/2021   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 [place] [address] Phone: [phone] Fax: [phone]     Social Determinants of Health (SDOH) Interventions    Readmission Risk Interventions No flowsheet data found.

## 2021-06-11 LAB — TYPE AND SCREEN
ABO/RH(D): A POS
Antibody Screen: NEGATIVE

## 2022-02-06 IMAGING — US US OB COMP +14 WK
1 series · 13 of 28 positions shown · non-contrast
Comparison: none

CLINICAL DATA: Habitual miscarriages.  Unknown LMP.

EXAM:
OBSTETRICAL ULTRASOUND >14 WKS

[Series 1: us ob comp +14 wk · 0.28mm/px · 13 of 88 slices shown]
[im 4/88]
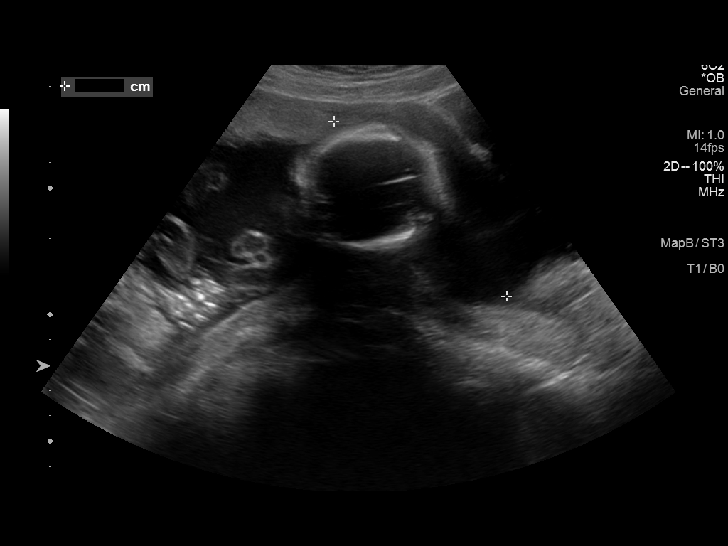
[im 10/88]
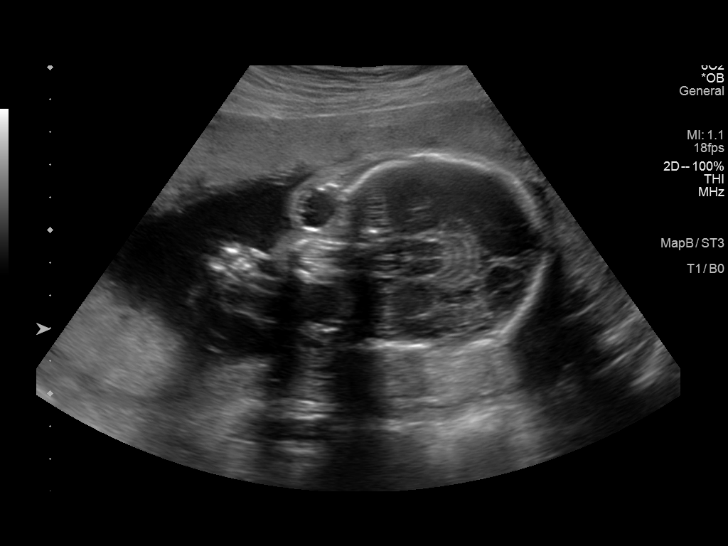
[im 17/88]
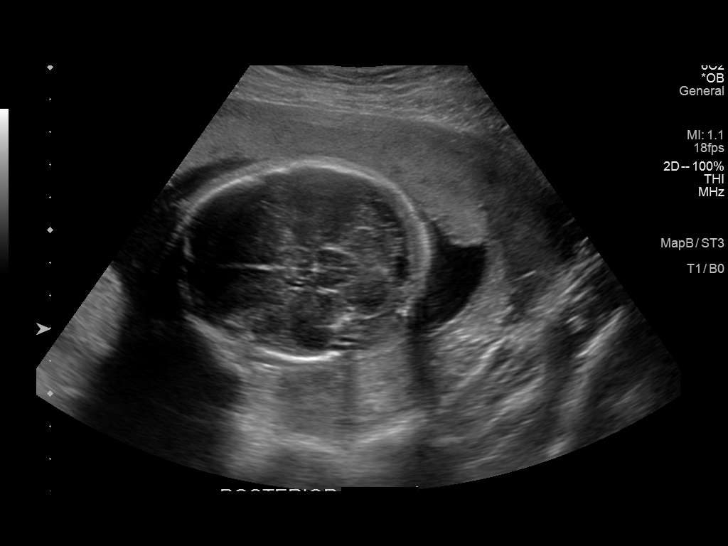
[im 23/88]
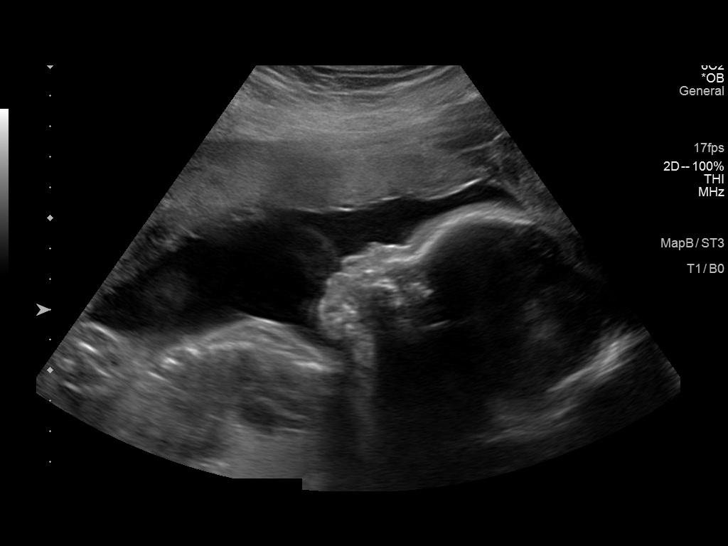
[im 30/88]
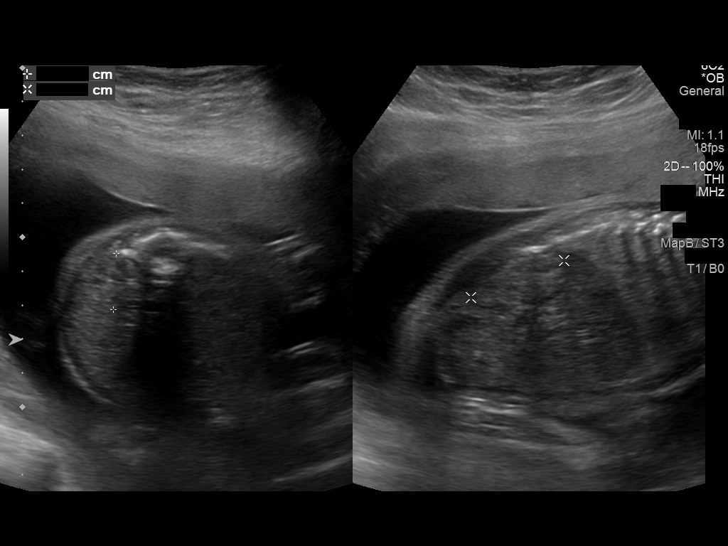
[im 36/88]
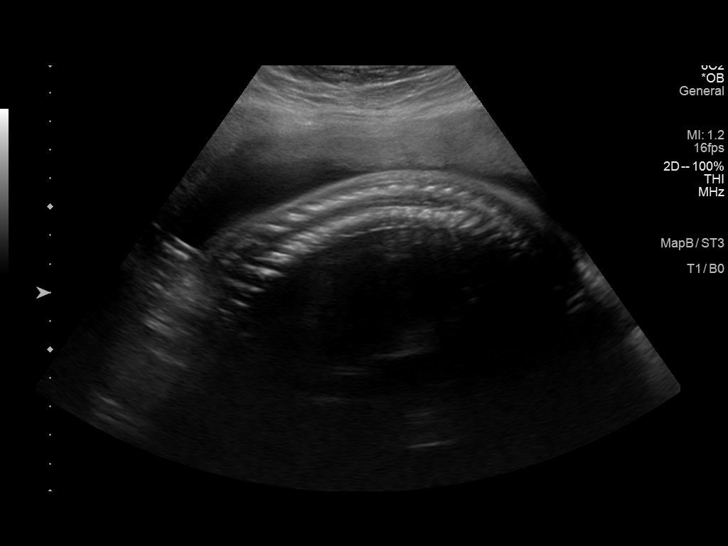
[im 46/88]
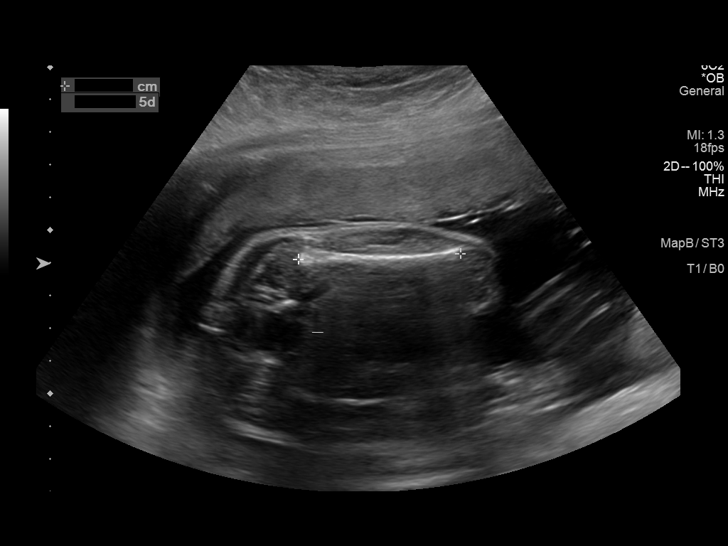
[im 52/88]
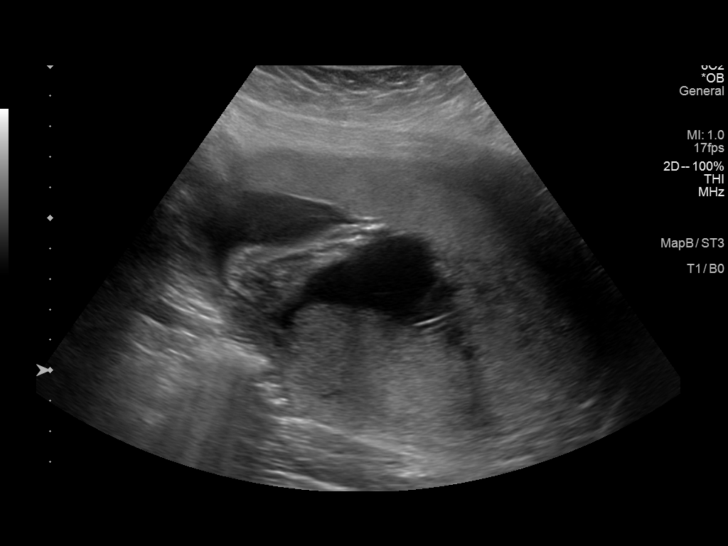
[im 59/88]
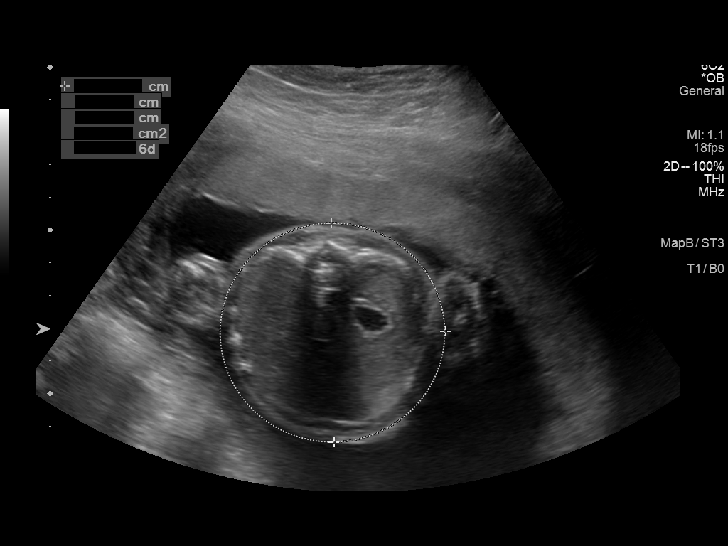
[im 65/88]
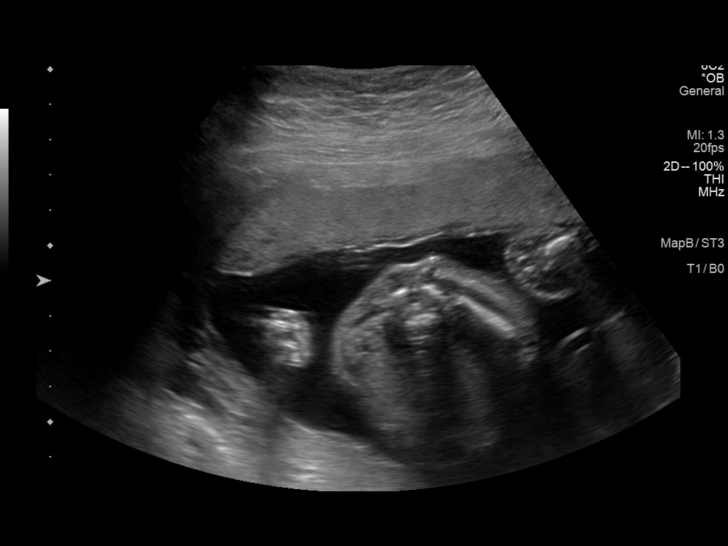
[im 71/88]
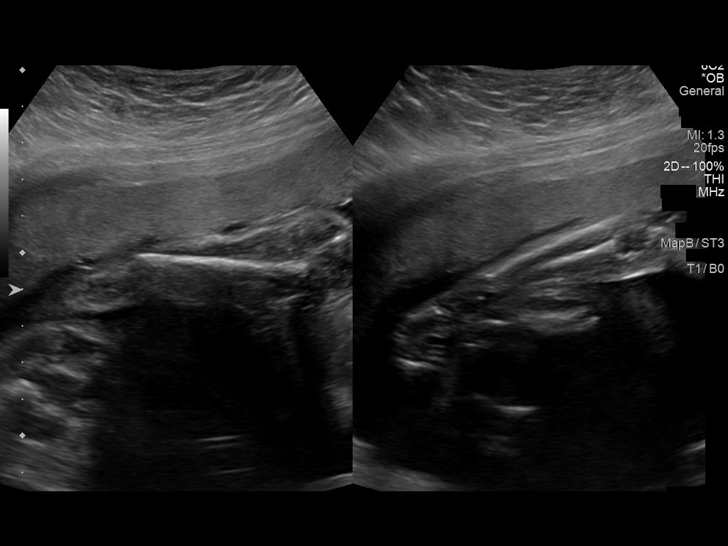
[im 78/88]
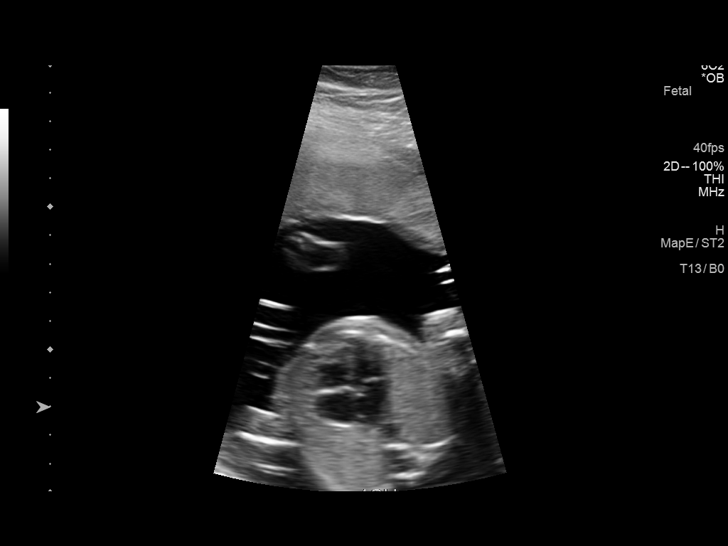
[im 84/88]
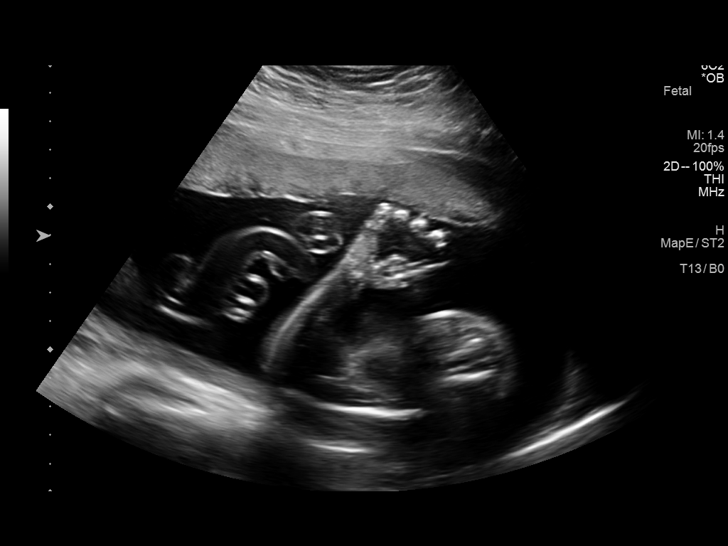

[13 of 28 positions shown; findings below may reference images not displayed]

FINDINGS: Number of Fetuses: 1

Heart Rate:  144 bpm

Movement: Yes

Presentation: Cephalic

Previa: No

Placental Location: Anterior

Amniotic Fluid (Subjective): Within normal limits

Amniotic Fluid (Objective):

Vertical pocket = 5.1cm

FETAL BIOMETRY

BPD: 6.3cm 25w 4d

HC:   23.4cm 25w 3d

AC:   21.4cm 25w 6d

FL:   5.0cm 26w 5d

Current Mean GA: 26w 2d US EDC: 07/01/2021

FETAL ANATOMY

Lateral Ventricles: Appears normal

Thalami/CSP: Appears normal

Posterior Fossa:  Appears normal

Nuchal Region: Appears normal   NFT= N/A > 20 WKS

Upper Lip: Appears normal

Spine: Appears normal

4 Chamber Heart on Left: Appears normal

LVOT: Appears normal

RVOT: Appears normal

Stomach on Left: Appears normal

3 Vessel Cord: Appears normal

Cord Insertion site: Appears normal

Kidneys: Appears normal

Bladder: Appears normal

Extremities: Appears normal

Sex: Male

Technically difficult due to: Advanced gestational age and fetal
position

Maternal Findings:

Cervix:  4.7 cm TA
IMPRESSION: Single living IUP with estimated gestational age of 26 weeks 2 days,
and US EDC of 07/01/2021.

Unremarkable fetal anatomic survey.  No fetal anomalies identified.

## 2023-06-30 ENCOUNTER — Emergency Department: Payer: Managed Care, Other (non HMO)

## 2023-06-30 ENCOUNTER — Other Ambulatory Visit: Payer: Self-pay

## 2023-06-30 ENCOUNTER — Emergency Department
Admission: EM | Admit: 2023-06-30 | Discharge: 2023-06-30 | Disposition: A | Discharge status: Home / Self Care | Payer: Managed Care, Other (non HMO) | Attending: Emergency Medicine | Admitting: Emergency Medicine

## 2023-06-30 DIAGNOSIS — X500XXA Overexertion from strenuous movement or load, initial encounter: Secondary | ICD-10-CM | POA: Diagnosis not present

## 2023-06-30 DIAGNOSIS — R0789 Other chest pain: Secondary | ICD-10-CM | POA: Insufficient documentation

## 2023-06-30 LAB — BASIC METABOLIC PANEL
Anion gap: 12 (ref 5–15)
BUN: 13 mg/dL (ref 6–20)
CO2: 25 mmol/L (ref 22–32)
Calcium: 9.4 mg/dL (ref 8.9–10.3)
Chloride: 105 mmol/L (ref 98–111)
Creatinine, Ser: 0.76 mg/dL (ref 0.44–1.00)
GFR, Estimated: >60 mL/min (ref >60)
Glucose, Bld: 89 mg/dL (ref 70–99)
Potassium: 4.1 mmol/L (ref 3.5–5.1)
Sodium: 142 mmol/L (ref 135–145)

## 2023-06-30 LAB — POC URINE PREG, ED: Preg Test, Ur: NEGATIVE

## 2023-06-30 LAB — TROPONIN I (HIGH SENSITIVITY)
Troponin I (High Sensitivity): <2 ng/L (ref <18)
Troponin I (High Sensitivity): <2 ng/L (ref <18)

## 2023-06-30 LAB — CBC
HCT: 40.8 % (ref 36.0–46.0)
Hemoglobin: 13.7 g/dL (ref 12.0–15.0)
MCH: 32 pg (ref 26.0–34.0)
MCHC: 33.6 g/dL (ref 30.0–36.0)
MCV: 95.3 fL (ref 80.0–100.0)
Platelets: 227 10*3/uL (ref 150–400)
RBC: 4.28 MIL/uL (ref 3.87–5.11)
RDW: 13.4 % (ref 11.5–15.5)
WBC: 7.1 10*3/uL (ref 4.0–10.5)
nRBC: 0 % (ref 0.0–0.2)

## 2023-06-30 MED ORDER — KETOROLAC TROMETHAMINE 30 MG/ML IJ SOLN
30.0000 mg | Freq: Once | INTRAMUSCULAR | Status: AC
Start: 2023-06-30 — End: 2023-06-30
  Administered 2023-06-30: 30 mg via INTRAMUSCULAR
  Filled 2023-06-30: qty 1

## 2023-06-30 NOTE — ED Triage Notes (Signed)
Patient states left sided chest pain that started while she was at work, denies SOB, vomiting and diaphoresis.

## 2023-06-30 NOTE — ED Notes (Signed)
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 Pts VSS with EMS. Pt was given 324 mg of Aspirin.

## 2023-06-30 NOTE — Discharge Instructions (Signed)
Please take Tylenol and ibuprofen/Advil for your pain.  It is safe to take them together, or to alternate them every few hours.  Take up to 1000mg of Tylenol at a time, up to 4 times per day.  Do not take more than 4000 mg of Tylenol in 24 hours.  For ibuprofen, take 400-600 mg, 3 - 4 times per day.  

## 2023-06-30 NOTE — ED Provider Notes (Signed)
[facility] Provider Note    Event Date/Time   First MD Initiated Contact with Patient 06/30/23 1219     (approximate)   History   Chest Pain   HPI  Patricia Sheppard is a 43 y.o. female who presents to the ED for evaluation of Chest Pain   Patient presents from her workplace for evaluation of left-sided chest discomfort in the past couple hours.  Symptoms started around 11:30 AM while she was at work.  Labeling and lifting boxes.  No preceding illnesses.  She reports waxing and waning intermittent discomfort to the left side of her chest without accompanying symptoms.   Physical Exam   Triage Vital Signs: ED Triage Vitals  Encounter Vitals Group     BP 06/30/23 1146 117/82     Systolic BP Percentile --      Diastolic BP Percentile --      Pulse Rate 06/30/23 1146 86     Resp 06/30/23 1146 18     Temp 06/30/23 1146 98.5 F (36.9 C)     Temp Source 06/30/23 1146 Oral     SpO2 06/30/23 1146 99 %     Weight 06/30/23 1145 170 lb (77.1 kg)     Height 06/30/23 1145 5\' 7"  (1.702 m)     Head Circumference --      Peak Flow --      Pain Score 06/30/23 1145 5     Pain Loc --      Pain Education --      Exclude from Growth Chart --     Most recent vital signs: Vitals:   06/30/23 1330 06/30/23 1427  BP: 113/82 118/78  Pulse: 67 68  Resp: 16 16  Temp:    SpO2: 100% 100%    General: Awake, no distress.  CV:  Good peripheral perfusion.  RRR without appreciable murmur Resp:  Normal effort.  Abd:  No distention.  MSK:  No deformity noted.  Reproducible chest discomfort without signs of trauma Neuro:  No focal deficits appreciated. Other:     ED Results / Procedures / Treatments   Labs (all labs ordered are listed, but only abnormal results are displayed) Labs Reviewed  BASIC METABOLIC PANEL  CBC  POC URINE PREG, ED  TROPONIN I (HIGH SENSITIVITY)  TROPONIN I (HIGH SENSITIVITY)    EKG Sinus rhythm with a rate of 77 bpm.  Normal axis and  intervals.  No clear signs of acute ischemia.  No comparison.  RADIOLOGY CXR interpreted by me without evidence of acute cardiopulmonary pathology.  Official radiology report(s): DG Chest 2 View Result Date: 06/30/2023 CLINICAL DATA:  Left-sided chest pain beginning today. EXAM: CHEST - 2 VIEW COMPARISON:  None Available. FINDINGS: The heart size and mediastinal contours are within normal limits. Both lungs are clear. The visualized skeletal structures are unremarkable. IMPRESSION: No active cardiopulmonary disease. Electronically Signed   By: Danae Orleans M.D.   On: 06/30/2023 12:10    PROCEDURES and INTERVENTIONS:  .1-3 Lead EKG Interpretation  Performed by: Delton Prairie, MD Authorized by: Delton Prairie, MD     Interpretation: normal     ECG rate:  70   ECG rate assessment: normal     Rhythm: sinus rhythm     Ectopy: none     Conduction: normal     Medications  ketorolac (TORADOL) 30 MG/ML injection 30 mg (30 mg Intramuscular Given 06/30/23 1250)     IMPRESSION / MDM / ASSESSMENT AND PLAN /  ED COURSE  I reviewed the triage vital signs and the nursing notes.  Differential diagnosis includes, but is not limited to, ACS, PTX, PNA, muscle strain/spasm, PE, dissection, anxiety, pleural effusion   {Patient presents with symptoms of an acute illness or injury that is potentially life-threatening.  Low risk patient presents with atypical chest discomfort that I suspect is MSK in etiology.  Looks well with reproducible pain.  No signs of trauma or skin changes.  Normal vitals.  Nonischemic EKG, negative troponin, normal metabolic panel and CBC.  Considering the chronicity of her symptoms and presentation to the ED and first troponin less than 2 hours prior to initiation of pain, we will keep the patient on the monitor, obtain a second troponin to help rule out ACS.  Provide Toradol due to high suspicion for MSK pain.  Reassessed after Toradol, improved discomfort.  2 negative troponins  and suitable for outpatient management.  Discussed ED return precautions.      FINAL CLINICAL IMPRESSION(S) / ED DIAGNOSES   Final diagnoses:  Other chest pain     Rx / DC Orders   ED Discharge Orders     None        Note:  This document was prepared using Dragon voice recognition software and may include unintentional dictation errors.   Delton Prairie, MD 06/30/23 [phone]
